# Patient Record
Sex: Female | Born: 1970 | Race: Black or African American | Hispanic: No | Marital: Single | Smoking: Current some day smoker
Health system: Southern US, Community
[De-identification: ages and names within clinical notes are randomized; demographics above are authoritative.]

## PROBLEM LIST (undated history)

## (undated) DIAGNOSIS — I639 Cerebral infarction, unspecified: Secondary | ICD-10-CM

## (undated) DIAGNOSIS — I10 Essential (primary) hypertension: Secondary | ICD-10-CM

## (undated) DIAGNOSIS — B20 Human immunodeficiency virus [HIV] disease: Secondary | ICD-10-CM

## (undated) HISTORY — DX: Cerebral infarction, unspecified: I63.9

---

## 2011-04-29 DIAGNOSIS — Z8719 Personal history of other diseases of the digestive system: Secondary | ICD-10-CM | POA: Insufficient documentation

## 2013-02-28 ENCOUNTER — Emergency Department (HOSPITAL_COMMUNITY)
Admission: EM | Admit: 2013-02-28 | Discharge: 2013-02-28 | Disposition: A | Discharge status: Home / Self Care | Payer: Medicaid Other | Attending: Emergency Medicine | Admitting: Emergency Medicine

## 2013-02-28 ENCOUNTER — Encounter (HOSPITAL_COMMUNITY): Payer: Self-pay | Admitting: Emergency Medicine

## 2013-02-28 DIAGNOSIS — R05 Cough: Secondary | ICD-10-CM | POA: Insufficient documentation

## 2013-02-28 DIAGNOSIS — R059 Cough, unspecified: Secondary | ICD-10-CM | POA: Insufficient documentation

## 2013-02-28 DIAGNOSIS — F17200 Nicotine dependence, unspecified, uncomplicated: Secondary | ICD-10-CM | POA: Insufficient documentation

## 2013-02-28 DIAGNOSIS — Z79899 Other long term (current) drug therapy: Secondary | ICD-10-CM | POA: Insufficient documentation

## 2013-02-28 DIAGNOSIS — H6691 Otitis media, unspecified, right ear: Secondary | ICD-10-CM

## 2013-02-28 DIAGNOSIS — J3489 Other specified disorders of nose and nasal sinuses: Secondary | ICD-10-CM | POA: Insufficient documentation

## 2013-02-28 DIAGNOSIS — J029 Acute pharyngitis, unspecified: Secondary | ICD-10-CM | POA: Insufficient documentation

## 2013-02-28 DIAGNOSIS — H6690 Otitis media, unspecified, unspecified ear: Secondary | ICD-10-CM | POA: Insufficient documentation

## 2013-02-28 MED ORDER — HYDROCODONE-ACETAMINOPHEN 5-325 MG PO TABS: 2.0000 | ORAL_TABLET | Freq: Once | ORAL | Status: DC

## 2013-02-28 MED ORDER — OXYMETAZOLINE HCL 0.05 % NA SOLN
1.0000 | Freq: Once | NASAL | Status: AC
  Administered 2013-02-28: 1 via NASAL
  Filled 2013-02-28: qty 15

## 2013-02-28 MED ORDER — OXYCODONE HCL 5 MG/5ML PO SOLN: 5.0000 mg | ORAL | Status: DC | PRN

## 2013-02-28 MED ORDER — DOCUSATE SODIUM 50 MG/5ML PO LIQD
50.0000 mg | Freq: Once | ORAL | Status: AC
  Administered 2013-02-28: 50 mg via OTIC
  Filled 2013-02-28: qty 10

## 2013-02-28 MED ORDER — FLUTICASONE PROPIONATE 50 MCG/ACT NA SUSP: 2.0000 | Freq: Every day | NASAL | Status: DC

## 2013-02-28 MED ORDER — AMOXICILLIN-POT CLAVULANATE 875-125 MG PO TABS: 1.0000 | ORAL_TABLET | Freq: Two times a day (BID) | ORAL | Status: DC

## 2013-02-28 MED ORDER — OXYCODONE HCL 5 MG/5ML PO SOLN
5.0000 mg | ORAL | Status: AC
  Administered 2013-02-28: 5 mg via ORAL
  Filled 2013-02-28: qty 5

## 2013-02-28 NOTE — ED Provider Notes (Addendum)
CSN: [id number]     Arrival date & time 02/28/13  1622 History  This chart was scribed for non-physician practitioner, Dierdre Forth, PA-C working with Dagmar Hait, MD by Greggory Stallion, ED scribe. This patient was seen in room TR10C/TR10C and the patient's care was started at 4:55 PM.   Chief Complaint  Patient presents with  . Otalgia  . Sore Throat   The history is provided by the patient. No language interpreter was used.    HPI Comments: Melissa Vaughan is a 42 y.o. female who presents to the Emergency Department complaining of gradual onset, constant right otalgia with associated sore throat and body aches that started yesterday. Pt's daughter states that the right side of her face was swollen this morning, but has since resolved. Pt states she has had an intermittent cough and drainage down her throat. She has not taken any medication for the symptoms. Pt denies fever and chills as associated symptoms. She smokes cigarettes sometimes.  She denies sick contacts.  History includes CVA many years ago.  History reviewed. No pertinent past medical history. History reviewed. No pertinent past surgical history. History reviewed. No pertinent family history. History  Substance Use Topics  . Smoking status: Current Every Day Smoker  . Smokeless tobacco: Not on file  . Alcohol Use: No   OB History   Grav Para Term Preterm Abortions TAB SAB Ect Mult Living                 Review of Systems  Constitutional: Negative for fever, diaphoresis, appetite change, fatigue and unexpected weight change.  HENT: Positive for ear pain, congestion, sore throat, rhinorrhea, postnasal drip and sinus pressure. Negative for mouth sores and neck stiffness.   Eyes: Negative for visual disturbance.  Respiratory: Positive for cough. Negative for chest tightness, shortness of breath and wheezing.   Cardiovascular: Negative for chest pain.  Gastrointestinal: Negative for nausea, vomiting,  abdominal pain, diarrhea and constipation.  Endocrine: Negative for polydipsia, polyphagia and polyuria.  Genitourinary: Negative for dysuria, urgency, frequency and hematuria.  Musculoskeletal: Negative for back pain.  Skin: Negative for rash.  Allergic/Immunologic: Negative for immunocompromised state.  Neurological: Negative for syncope, light-headedness and headaches.  Hematological: Does not bruise/bleed easily.  Psychiatric/Behavioral: Negative for sleep disturbance. The patient is not nervous/anxious.   All other systems reviewed and are negative.    Allergies  Review of patient's allergies indicates no known allergies.  Home Medications   Current Outpatient Rx  Name  Route  Sig  Dispense  Refill  . darunavir (PREZISTA) 400 MG tablet   Oral   Take 800 mg by mouth daily with breakfast.         . emtricitabine-tenofovir (TRUVADA) 200-300 MG per tablet   Oral   Take 1 tablet by mouth daily.         Marland Kitchen ibuprofen (ADVIL,MOTRIN) 800 MG tablet   Oral   Take 800 mg by mouth every 8 (eight) hours as needed for pain.         Marland Kitchen lisinopril-hydrochlorothiazide (PRINZIDE,ZESTORETIC) 10-12.5 MG per tablet   Oral   Take 1 tablet by mouth daily.         Marland Kitchen omeprazole (PRILOSEC) 40 MG capsule   Oral   Take 40 mg by mouth daily.         . ritonavir (NORVIR) 100 MG capsule   Oral   Take 100 mg by mouth daily.         . traZODone (DESYREL)  50 MG tablet   Oral   Take 50 mg by mouth at bedtime.         . valACYclovir (VALTREX) 500 MG tablet   Oral   Take 1,000 mg by mouth daily.         Marland Kitchen amoxicillin-clavulanate (AUGMENTIN) 875-125 MG per tablet   Oral   Take 1 tablet by mouth every 12 (twelve) hours.   14 tablet   0   . fluticasone (FLONASE) 50 MCG/ACT nasal spray   Nasal   Place 2 sprays into the nose daily.   16 g   0   . oxyCODONE (ROXICODONE) 5 MG/5ML solution   Oral   Take 5 mLs (5 mg total) by mouth every 4 (four) hours as needed for pain.   35  mL   0     BP 140/124  Pulse 81  Temp(Src) 99.1 F (37.3 C) (Oral)  Resp 16  SpO2 100%  Physical Exam  Nursing note and vitals reviewed. Constitutional: She is oriented to person, place, and time. She appears well-developed and well-nourished. No distress.  HENT:  Head: Normocephalic and atraumatic.  Right Ear: External ear normal.  Left Ear: Hearing, tympanic membrane, external ear and ear canal normal. Tympanic membrane is not erythematous and not bulging.  Nose: Mucosal edema and rhinorrhea present. No epistaxis. Right sinus exhibits no maxillary sinus tenderness and no frontal sinus tenderness. Left sinus exhibits no maxillary sinus tenderness and no frontal sinus tenderness.  Mouth/Throat: Uvula is midline and mucous membranes are normal. Mucous membranes are not pale and not cyanotic. Posterior oropharyngeal erythema present. No oropharyngeal exudate, posterior oropharyngeal edema or tonsillar abscesses.  Right TM blocked by cerumen.  Oropharyngeal erythema without edema or tonsilar enlargement  Eyes: Conjunctivae are normal. Pupils are equal, round, and reactive to light. No scleral icterus.  Neck: Normal range of motion and full passive range of motion without pain. Neck supple.  Pt airway intact Handling secretions without difficulty and tolerating PO without difficulty  Cardiovascular: Normal rate, regular rhythm, normal heart sounds and intact distal pulses.   No murmur heard. Pulmonary/Chest: Effort normal and breath sounds normal. No stridor. No respiratory distress. She has no wheezes.  Clear and equal breath sounds No rales or rhonchi  Abdominal: Soft. Bowel sounds are normal. She exhibits no mass. There is no tenderness. There is no rebound and no guarding.  Musculoskeletal: Normal range of motion. She exhibits no edema and no tenderness.  Lymphadenopathy:    She has no cervical adenopathy.  Neurological: She is alert and oriented to person, place, and time. She  exhibits normal muscle tone. Coordination normal.  Speech is clear and goal oriented Moves extremities without ataxia  Skin: Skin is warm and dry. No rash noted. She is not diaphoretic. No erythema.  Psychiatric: She has a normal mood and affect.    ED Course   EAR CERUMEN REMOVAL Date/Time: 02/28/2013 5:11 PM Performed by: Dierdre Forth Authorized by: Dierdre Forth Consent: Verbal consent obtained. Risks and benefits: risks, benefits and alternatives were discussed Consent given by: patient Patient understanding: patient states understanding of the procedure being performed Patient consent: the patient's understanding of the procedure matches consent given Procedure consent: procedure consent matches procedure scheduled Relevant documents: relevant documents present and verified Site marked: the operative site was marked Required items: required blood products, implants, devices, and special equipment available Patient identity confirmed: verbally with patient and arm band Time out: Immediately prior to procedure a "time out" was  called to verify the correct patient, procedure, equipment, support staff and site/side marked as required. Local anesthetic: none Ceruminolytics applied: Ceruminolytics applied prior to the procedure. Location details: right ear Procedure type: curette and irrigation Patient sedated: no Patient tolerance: Patient tolerated the procedure well with no immediate complications.   (including critical care time)  DIAGNOSTIC STUDIES: Oxygen Saturation is 100% on RA, normal by my interpretation.    COORDINATION OF CARE: 5:43 PM-Discussed treatment plan which includes getting the wax out of her right ear, antibiotic and nasal spray with pt at bedside and pt agreed to plan. Advised pt she has a viral URI.  Labs Reviewed - No data to display No results found. 1. Otitis media, right     MDM  Dairl Ponder presents with right ear pain.  Ear  wax removal from the right ear without complication. On reevaluation after ear wax removal patient with florid otitis media with erythematous and bulging TM with purulent middle ear effusion. Patient presents with otalgia and exam consistent with acute otitis media. No concern for acute mastoiditis, meningitis.  Patient discharged home with Augmentin.  Advised patient to followup with her primary care physician or use the resource guide to find one.  I have also discussed reasons to return immediately to the ER.  Parent expresses understanding and agrees with plan.  I personally performed the services described in this documentation, which was scribed in my presence. The recorded information has been reviewed and is accurate.   Dahlia Client Zakarie Sturdivant, PA-C 02/28/13 2240  Dierdre Forth, PA-C 03/08/13 1511

## 2013-02-28 NOTE — ED Notes (Signed)
Pt c/o right sided earache and sore throat with body aches

## 2013-03-01 NOTE — ED Provider Notes (Signed)
I personally performed the services described in this documentation, which was scribed in my presence. The recorded information has been reviewed and is accurate.   Dagmar Hait, MD 03/01/13 [phone]

## 2013-03-08 NOTE — ED Provider Notes (Signed)
Medical screening examination/treatment/procedure(s) were performed by non-physician practitioner and as supervising physician I was immediately available for consultation/collaboration.   Dagmar Hait, MD 03/08/13 [phone]

## 2013-05-21 ENCOUNTER — Emergency Department (HOSPITAL_COMMUNITY)
Admission: EM | Admit: 2013-05-21 | Discharge: 2013-05-21 | Disposition: A | Discharge status: Home / Self Care | Payer: Medicaid Other | Attending: Emergency Medicine | Admitting: Emergency Medicine

## 2013-05-21 ENCOUNTER — Encounter (HOSPITAL_COMMUNITY): Payer: Self-pay | Admitting: Emergency Medicine

## 2013-05-21 DIAGNOSIS — K08109 Complete loss of teeth, unspecified cause, unspecified class: Secondary | ICD-10-CM | POA: Insufficient documentation

## 2013-05-21 DIAGNOSIS — H9210 Otorrhea, unspecified ear: Secondary | ICD-10-CM | POA: Insufficient documentation

## 2013-05-21 DIAGNOSIS — F17200 Nicotine dependence, unspecified, uncomplicated: Secondary | ICD-10-CM | POA: Insufficient documentation

## 2013-05-21 DIAGNOSIS — Z79899 Other long term (current) drug therapy: Secondary | ICD-10-CM | POA: Insufficient documentation

## 2013-05-21 DIAGNOSIS — K029 Dental caries, unspecified: Secondary | ICD-10-CM | POA: Insufficient documentation

## 2013-05-21 DIAGNOSIS — H9209 Otalgia, unspecified ear: Secondary | ICD-10-CM | POA: Insufficient documentation

## 2013-05-21 MED ORDER — PENICILLIN V POTASSIUM 250 MG PO TABS
500.0000 mg | ORAL_TABLET | Freq: Four times a day (QID) | ORAL | Status: DC
  Administered 2013-05-21: 500 mg via ORAL
  Filled 2013-05-21: qty 2

## 2013-05-21 MED ORDER — HYDROCODONE-ACETAMINOPHEN 5-325 MG PO TABS
1.0000 | ORAL_TABLET | Freq: Once | ORAL | Status: AC
  Administered 2013-05-21: 1 via ORAL
  Filled 2013-05-21: qty 1

## 2013-05-21 MED ORDER — PENICILLIN V POTASSIUM 500 MG PO TABS: 500.0000 mg | ORAL_TABLET | Freq: Three times a day (TID) | ORAL | Status: DC

## 2013-05-21 MED ORDER — HYDROCODONE-ACETAMINOPHEN 5-325 MG PO TABS: 1.0000 | ORAL_TABLET | ORAL | Status: DC | PRN

## 2013-05-21 NOTE — ED Notes (Signed)
Pt c/o R sided tooth and ear ache since last night.

## 2013-05-21 NOTE — ED Provider Notes (Signed)
CSN: [id number]     Arrival date & time 05/21/13  1045 History   This chart was scribed for non-physician practitioner Elpidio Anis, PA-C, working with Enid Skeens, MD, by Yevette Edwards, ED Scribe. This patient was seen in room TR05C/TR05C and the patient's care was started at 11:35 AM.  First MD Initiated Contact with Patient 05/21/13 1100     No chief complaint on file.   The history is provided by the patient. No language interpreter was used.   HPI Comments: Melissa Vaughan is a 42 y.o. female who presents to the Emergency Department complaining of sudden-onset right-sided tooth pain with the associated symptom of right-sided otalgia which began yesterday evening and which she rates as 10/10. She denies any known fractures to the affected  tooth. She reports drainage from her right ear. The pt denies a known fever; her temperature in the ED is 20 F. She denies DM. The pt is a daily smoker.   No past medical history on file. No past surgical history on file. No family history on file. History  Substance Use Topics  . Smoking status: Current Every Day Smoker  . Smokeless tobacco: Not on file  . Alcohol Use: No   No OB history provided.  Review of Systems  Constitutional: Negative for fever.  HENT: Positive for dental problem and ear pain.     Allergies  Review of patient's allergies indicates no known allergies.  Home Medications   Current Outpatient Rx  Name  Route  Sig  Dispense  Refill  . amoxicillin-clavulanate (AUGMENTIN) 875-125 MG per tablet   Oral   Take 1 tablet by mouth every 12 (twelve) hours.   14 tablet   0   . darunavir (PREZISTA) 400 MG tablet   Oral   Take 800 mg by mouth daily with breakfast.         . emtricitabine-tenofovir (TRUVADA) 200-300 MG per tablet   Oral   Take 1 tablet by mouth daily.         . fluticasone (FLONASE) 50 MCG/ACT nasal spray   Nasal   Place 2 sprays into the nose daily.   16 g   0   . ibuprofen  (ADVIL,MOTRIN) 800 MG tablet   Oral   Take 800 mg by mouth every 8 (eight) hours as needed for pain.         Marland Kitchen lisinopril-hydrochlorothiazide (PRINZIDE,ZESTORETIC) 10-12.5 MG per tablet   Oral   Take 1 tablet by mouth daily.         Marland Kitchen omeprazole (PRILOSEC) 40 MG capsule   Oral   Take 40 mg by mouth daily.         Marland Kitchen oxyCODONE (ROXICODONE) 5 MG/5ML solution   Oral   Take 5 mLs (5 mg total) by mouth every 4 (four) hours as needed for pain.   35 mL   0   . ritonavir (NORVIR) 100 MG capsule   Oral   Take 100 mg by mouth daily.         . traZODone (DESYREL) 50 MG tablet   Oral   Take 50 mg by mouth at bedtime.          Triage Vitals: BP 98/66  Pulse 86  Temp(Src) 99 F (37.2 C) (Oral)  Resp 16  Ht 5\' 3"  (1.6 m)  Wt 145 lb (65.772 kg)  BMI 25.69 kg/m2  SpO2 99%  Physical Exam  Nursing note and vitals reviewed. Constitutional: She is oriented to person,  place, and time. She appears well-developed and well-nourished. No distress.  HENT:  Head: Normocephalic and atraumatic.  Right Ear: External ear normal.  Left Ear: External ear normal.  Partial edentureless with marked decay generally. No pointing abscess.  No facial swelling.  No adenopathy.    Eyes: EOM are normal.  Neck: Neck supple. No tracheal deviation present.  Pulmonary/Chest: Effort normal.  Musculoskeletal: Normal range of motion.  Lymphadenopathy:    She has no cervical adenopathy.  Neurological: She is alert and oriented to person, place, and time.  Skin: Skin is warm and dry.  Psychiatric: She has a normal mood and affect. Her behavior is normal.    ED Course  Procedures (including critical care time)  DIAGNOSTIC STUDIES: Oxygen Saturation is 99% on room air, normal by my interpretation.    COORDINATION OF CARE:  11:38 AM- Discussed treatment plan with patient, and the patient agreed to the plan.   Labs Review Labs Reviewed - No data to display Imaging Review No results found.  EKG  Interpretation   None       MDM  No diagnosis found. 1. Dental caries  Uncomplicated dental caries causing jaw and ear pain. No evidence for otitis infection or TMJ.   I personally performed the services described in this documentation, which was scribed in my presence. The recorded information has been reviewed and is accurate.     Arnoldo Hooker, PA-C 05/21/13 1144

## 2013-05-21 NOTE — ED Provider Notes (Signed)
Medical screening examination/treatment/procedure(s) were performed by non-physician practitioner and as supervising physician I was immediately available for consultation/collaboration.  EKG Interpretation   None         Enid Skeens, MD 05/21/13 1720

## 2015-04-12 ENCOUNTER — Emergency Department (HOSPITAL_COMMUNITY)
Admission: EM | Admit: 2015-04-12 | Discharge: 2015-04-12 | Disposition: A | Discharge status: Home / Self Care | Payer: Medicaid Other | Attending: Emergency Medicine | Admitting: Emergency Medicine

## 2015-04-12 ENCOUNTER — Encounter (HOSPITAL_COMMUNITY): Payer: Self-pay | Admitting: Cardiology

## 2015-04-12 DIAGNOSIS — H9201 Otalgia, right ear: Secondary | ICD-10-CM | POA: Diagnosis present

## 2015-04-12 DIAGNOSIS — J029 Acute pharyngitis, unspecified: Secondary | ICD-10-CM | POA: Diagnosis not present

## 2015-04-12 DIAGNOSIS — Z720 Tobacco use: Secondary | ICD-10-CM | POA: Diagnosis not present

## 2015-04-12 DIAGNOSIS — Z79899 Other long term (current) drug therapy: Secondary | ICD-10-CM | POA: Insufficient documentation

## 2015-04-12 MED ORDER — AMOXICILLIN 500 MG PO CAPS: 500.0000 mg | ORAL_CAPSULE | Freq: Three times a day (TID) | ORAL | Status: DC

## 2015-04-12 NOTE — ED Provider Notes (Signed)
CSN: [id number]     Arrival date & time 04/12/15  1010 History  This chart was scribed for non-physician practitioner Roxy Horseman, PA-C, working with Lorre Nick, MD, by Tanda Rockers, ED Scribe. This patient was seen in room TR07C/TR07C and the patient's care was started at 11:24 AM.  Chief Complaint  Patient presents with  . Otalgia  . Sore Throat   The history is provided by the patient. No language interpreter was used.     HPI Comments: Melissa Vaughan is a 44 y.o. female who presents to the Emergency Department complaining of gradual onset, constant, 10/10 right ear pain x 1 day, worsening today. Pt also complains of cough and right sided sore throat when coughing. Denies fever, chills, or any other associated symptoms. No recent sick contact with similar symptoms. Pt has not been swimming recently.   History reviewed. No pertinent past medical history. History reviewed. No pertinent past surgical history. History reviewed. No pertinent family history. Social History  Substance Use Topics  . Smoking status: Current Every Day Smoker    Types: Cigarettes  . Smokeless tobacco: None  . Alcohol Use: No   OB History    No data available     Review of Systems  Constitutional: Negative for fever and chills.  HENT: Positive for ear pain (Right) and sore throat.   Respiratory: Positive for cough.   Musculoskeletal: Negative for neck pain.  Neurological: Negative for headaches.    Allergies  Review of patient's allergies indicates no known allergies.  Home Medications   Prior to Admission medications   Medication Sig Start Date End Date Taking? Authorizing Provider  darunavir (PREZISTA) 400 MG tablet Take 400 mg by mouth daily.     Historical Provider, MD  emtricitabine-tenofovir (TRUVADA) 200-300 MG per tablet Take 1 tablet by mouth daily.    Historical Provider, MD  HYDROcodone-acetaminophen (NORCO/VICODIN) 5-325 MG per tablet Take 1-2 tablets by mouth every 4 (four)  hours as needed for pain. 05/21/13   Elpidio Anis, PA-C  lisinopril-hydrochlorothiazide (PRINZIDE,ZESTORETIC) 10-12.5 MG per tablet Take 1 tablet by mouth daily.    Historical Provider, MD  penicillin v potassium (VEETID) 500 MG tablet Take 1 tablet (500 mg total) by mouth 3 (three) times daily. 05/21/13   Elpidio Anis, PA-C  ritonavir (NORVIR) 100 MG capsule Take 100 mg by mouth daily.    Historical Provider, MD   Triage Vitals: BP 113/77 mmHg  Pulse 97  Temp(Src) 98.5 F (36.9 C) (Oral)  Resp 16  SpO2 100%  LMP 03/12/2015   Physical Exam  Constitutional: She is oriented to person, place, and time. She appears well-developed and well-nourished. No distress.  HENT:  Head: Normocephalic and atraumatic.  Right Ear: External ear normal.  Left Ear: External ear normal.  Mildly erythematous, no tonsillar exudate, no abscess, no stridor, uvula is midline  Right TM moderately erythematous with effusion  Eyes: Conjunctivae and EOM are normal. Pupils are equal, round, and reactive to light.  Neck: Normal range of motion. Neck supple. No tracheal deviation present.  Cardiovascular: Normal rate, regular rhythm and normal heart sounds.  Exam reveals no gallop and no friction rub.   No murmur heard. Pulmonary/Chest: Effort normal and breath sounds normal. No stridor. No respiratory distress. She has no wheezes. She has no rales. She exhibits no tenderness.  CTAB  Abdominal: Soft. Bowel sounds are normal. She exhibits no distension. There is no tenderness.  Musculoskeletal: Normal range of motion. She exhibits no tenderness.  Neurological: She  is alert and oriented to person, place, and time.  Skin: Skin is warm and dry. No rash noted. She is not diaphoretic.  Psychiatric: She has a normal mood and affect. Her behavior is normal. Judgment and thought content normal.  Nursing note and vitals reviewed.   ED Course  Procedures (including critical care time)  DIAGNOSTIC STUDIES: Oxygen  Saturation is 100% on RA, normal by my interpretation.    COORDINATION OF CARE: 11:26 AM-Discussed treatment plan which includes Rx antibiotics with pt at bedside and pt agreed to plan.    MDM   Final diagnoses:  Otalgia, right   Symptoms and physical exam consistent with right-sided otitis media, will treat with amoxicillin. Discharged home.  I personally performed the services described in this documentation, which was scribed in my presence. The recorded information has been reviewed and is accurate.       Roxy Horseman, PA-C 04/13/15 1612  Lorre Nick, MD 04/16/15 [phone]

## 2015-04-12 NOTE — ED Notes (Signed)
Pt reports right ear pain and sore throat since last night.

## 2015-04-12 NOTE — Discharge Instructions (Signed)
Otalgia  The most common reason for this in children is an infection of the middle ear. Pain from the middle ear is usually caused by a build-up of fluid and pressure behind the eardrum. Pain from an earache can be sharp, dull, or burning. The pain may be temporary or constant. The middle ear is connected to the nasal passages by a short narrow tube called the Eustachian tube. The Eustachian tube allows fluid to drain out of the middle ear, and helps keep the pressure in your ear equalized.  CAUSES   A cold or allergy can block the Eustachian tube with inflammation and the build-up of secretions. This is especially likely in small children, because their Eustachian tube is shorter and more horizontal. When the Eustachian tube closes, the normal flow of fluid from the middle ear is stopped. Fluid can accumulate and cause stuffiness, pain, hearing loss, and an ear infection if germs start growing in this area.  SYMPTOMS   The symptoms of an ear infection may include fever, ear pain, fussiness, increased crying, and irritability. Many children will have temporary and minor hearing loss during and right after an ear infection. Permanent hearing loss is rare, but the risk increases the more infections a child has. Other causes of ear pain include retained water in the outer ear canal from swimming and bathing.  Ear pain in adults is less likely to be from an ear infection. Ear pain may be referred from other locations. Referred pain may be from the joint between your jaw and the skull. It may also come from a tooth problem or problems in the neck. Other causes of ear pain include:   A foreign body in the ear.   Outer ear infection.   Sinus infections.   Impacted ear wax.   Ear injury.   Arthritis of the jaw or TMJ problems.   Middle ear infection.   Tooth infections.   Sore throat with pain to the ears.  DIAGNOSIS   Your caregiver can usually make the diagnosis by examining you. Sometimes other special studies,  including x-rays and lab work may be necessary.  TREATMENT    If antibiotics were prescribed, use them as directed and finish them even if you or your child's symptoms seem to be improved.   Sometimes PE tubes are needed in children. These are little plastic tubes which are put into the eardrum during a simple surgical procedure. They allow fluid to drain easier and allow the pressure in the middle ear to equalize. This helps relieve the ear pain caused by pressure changes.  HOME CARE INSTRUCTIONS    Only take over-the-counter or prescription medicines for pain, discomfort, or fever as directed by your caregiver. DO NOT GIVE CHILDREN ASPIRIN because of the association of Reye's Syndrome in children taking aspirin.   Use a cold pack applied to the outer ear for 15-20 minutes, 03-04 times per day or as needed may reduce pain. Do not apply ice directly to the skin. You may cause frost bite.   Over-the-counter ear drops used as directed may be effective. Your caregiver may sometimes prescribe ear drops.   Resting in an upright position may help reduce pressure in the middle ear and relieve pain.   Ear pain caused by rapidly descending from high altitudes can be relieved by swallowing or chewing gum. Allowing infants to suck on a bottle during airplane travel can help.   Do not smoke in the house or near children. If you are   unable to quit smoking, smoke outside.   Control allergies.  SEEK IMMEDIATE MEDICAL CARE IF:    You or your child are becoming sicker.   Pain or fever relief is not obtained with medicine.   You or your child's symptoms (pain, fever, or irritability) do not improve within 24 to 48 hours or as instructed.   Severe pain suddenly stops hurting. This may indicate a ruptured eardrum.   You or your children develop new problems such as severe headaches, stiff neck, difficulty swallowing, or swelling of the face or around the ear.  Document Released: 02/21/2004 Document Revised: 09/28/2011  Document Reviewed: 06/27/2008  [employer] Patient Information 2015 [employer]. This information is not intended to replace advice given to you by your health care provider. Make sure you discuss any questions you have with your health care provider.

## 2015-11-09 ENCOUNTER — Encounter (HOSPITAL_COMMUNITY): Payer: Self-pay

## 2015-11-09 ENCOUNTER — Emergency Department (HOSPITAL_COMMUNITY): Payer: Medicaid Other

## 2015-11-09 ENCOUNTER — Emergency Department (HOSPITAL_COMMUNITY)
Admission: EM | Admit: 2015-11-09 | Discharge: 2015-11-09 | Disposition: A | Discharge status: Home / Self Care | Payer: Medicaid Other | Attending: Emergency Medicine | Admitting: Emergency Medicine

## 2015-11-09 DIAGNOSIS — N83202 Unspecified ovarian cyst, left side: Secondary | ICD-10-CM | POA: Insufficient documentation

## 2015-11-09 DIAGNOSIS — R109 Unspecified abdominal pain: Secondary | ICD-10-CM | POA: Diagnosis present

## 2015-11-09 DIAGNOSIS — I10 Essential (primary) hypertension: Secondary | ICD-10-CM | POA: Insufficient documentation

## 2015-11-09 DIAGNOSIS — R51 Headache: Secondary | ICD-10-CM | POA: Insufficient documentation

## 2015-11-09 DIAGNOSIS — F17210 Nicotine dependence, cigarettes, uncomplicated: Secondary | ICD-10-CM | POA: Insufficient documentation

## 2015-11-09 DIAGNOSIS — Z79899 Other long term (current) drug therapy: Secondary | ICD-10-CM | POA: Insufficient documentation

## 2015-11-09 DIAGNOSIS — N3000 Acute cystitis without hematuria: Secondary | ICD-10-CM | POA: Diagnosis not present

## 2015-11-09 DIAGNOSIS — Z3202 Encounter for pregnancy test, result negative: Secondary | ICD-10-CM | POA: Insufficient documentation

## 2015-11-09 DIAGNOSIS — R519 Headache, unspecified: Secondary | ICD-10-CM

## 2015-11-09 HISTORY — DX: Essential (primary) hypertension: I10

## 2015-11-09 LAB — COMPREHENSIVE METABOLIC PANEL
ALBUMIN: 3.8 g/dL (ref 3.5–5.0)
ALT: 10 U/L — ABNORMAL LOW (ref 14–54)
ANION GAP: 13 (ref 5–15)
AST: 17 U/L (ref 15–41)
Alkaline Phosphatase: 77 U/L (ref 38–126)
BUN: 12 mg/dL (ref 6–20)
CO2: 18 mmol/L — AB (ref 22–32)
Calcium: 8.9 mg/dL (ref 8.9–10.3)
Chloride: 109 mmol/L (ref 101–111)
Creatinine, Ser: 1.1 mg/dL — ABNORMAL HIGH (ref 0.44–1.00)
GFR calc Af Amer: >60 mL/min (ref >60)
GFR calc non Af Amer: >60 mL/min (ref >60)
GLUCOSE: 138 mg/dL — AB (ref 65–99)
POTASSIUM: 3.3 mmol/L — AB (ref 3.5–5.1)
SODIUM: 140 mmol/L (ref 135–145)
TOTAL PROTEIN: 7.4 g/dL (ref 6.5–8.1)
Total Bilirubin: 0.5 mg/dL (ref 0.3–1.2)

## 2015-11-09 LAB — URINALYSIS, ROUTINE W REFLEX MICROSCOPIC
Bilirubin Urine: NEGATIVE
Glucose, UA: NEGATIVE mg/dL
KETONES UR: NEGATIVE mg/dL
NITRITE: POSITIVE — AB
PH: 6 (ref 5.0–8.0)
Protein, ur: NEGATIVE mg/dL
SPECIFIC GRAVITY, URINE: 1.022 (ref 1.005–1.030)

## 2015-11-09 LAB — CBC WITH DIFFERENTIAL/PLATELET
BASOS ABS: 0 10*3/uL (ref 0.0–0.1)
BASOS PCT: 0 %
Eosinophils Absolute: 0.4 10*3/uL (ref 0.0–0.7)
Eosinophils Relative: 8 %
HCT: 35.5 % — ABNORMAL LOW (ref 36.0–46.0)
Hemoglobin: 11.5 g/dL — ABNORMAL LOW (ref 12.0–15.0)
LYMPHS PCT: 33 %
Lymphs Abs: 1.7 10*3/uL (ref 0.7–4.0)
MCH: 29.5 pg (ref 26.0–34.0)
MCHC: 32.4 g/dL (ref 30.0–36.0)
MCV: 91 fL (ref 78.0–100.0)
MONOS PCT: 8 %
Monocytes Absolute: 0.4 10*3/uL (ref 0.1–1.0)
NEUTROS ABS: 2.6 10*3/uL (ref 1.7–7.7)
NEUTROS PCT: 51 %
Platelets: 225 10*3/uL (ref 150–400)
RBC: 3.9 MIL/uL (ref 3.87–5.11)
RDW: 13.6 % (ref 11.5–15.5)
WBC: 5.1 10*3/uL (ref 4.0–10.5)

## 2015-11-09 LAB — URINE MICROSCOPIC-ADD ON: RBC / HPF: NONE SEEN RBC/hpf (ref 0–5)

## 2015-11-09 LAB — PREGNANCY, URINE: PREG TEST UR: NEGATIVE

## 2015-11-09 LAB — LIPASE, BLOOD: Lipase: 26 U/L (ref 11–51)

## 2015-11-09 MED ORDER — IBUPROFEN 400 MG PO TABS
600.0000 mg | ORAL_TABLET | Freq: Once | ORAL | Status: AC
  Administered 2015-11-09: 600 mg via ORAL
  Filled 2015-11-09: qty 1

## 2015-11-09 MED ORDER — IOPAMIDOL (ISOVUE-300) INJECTION 61%
INTRAVENOUS | Status: AC
Start: 2015-11-09 — End: 2015-11-09
  Administered 2015-11-09: 100 mL
  Filled 2015-11-09: qty 100

## 2015-11-09 MED ORDER — CEPHALEXIN 500 MG PO CAPS: 500.0000 mg | ORAL_CAPSULE | Freq: Three times a day (TID) | ORAL | Status: DC

## 2015-11-09 MED ORDER — BUTALBITAL-APAP-CAFFEINE 50-325-40 MG PO TABS
1.0000 | ORAL_TABLET | Freq: Once | ORAL | Status: AC
  Administered 2015-11-09: 1 via ORAL
  Filled 2015-11-09: qty 1

## 2015-11-09 NOTE — ED Notes (Signed)
MD at bedside. 

## 2015-11-09 NOTE — ED Notes (Addendum)
Pt. Coming from home c/o abd. Pain and headache starting last night. Pt. sts her stomach is bloated and it hurts when she urinates. Pt. Denies N/V/D. Pt. sts headache 9/10. Pt. On cell phone at this time.

## 2015-11-09 NOTE — Discharge Instructions (Signed)
Continue to take Tylenol and Motrin as needed for your headache and left-sided abdominal pain which is due to her ovarian cyst. Return for worsening symptoms including fever, worsening pain, vomiting unable to keep down food or fluids, or any other symptoms concerning to you.  General Headache Without Cause A headache is pain or discomfort felt around the head or neck area. The specific cause of a headache may not be found. There are many causes and types of headaches. A few common ones are:  Tension headaches.  Migraine headaches.  Cluster headaches.  Chronic daily headaches. HOME CARE INSTRUCTIONS  Watch your condition for any changes. Take these steps to help with your condition: Managing Pain  Take over-the-counter and prescription medicines only as told by your health care provider.  Lie down in a dark, quiet room when you have a headache.  If directed, apply ice to the head and neck area:  Put ice in a plastic bag.  Place a towel between your skin and the bag.  Leave the ice on for 20 minutes, 2-3 times per day.  Use a heating pad or hot shower to apply heat to the head and neck area as told by your health care provider.  Keep lights dim if bright lights bother you or make your headaches worse. Eating and Drinking  Eat meals on a regular schedule.  Limit alcohol use.  Decrease the amount of caffeine you drink, or stop drinking caffeine. General Instructions  Keep all follow-up visits as told by your health care provider. This is important.  Keep a headache journal to help find out what may trigger your headaches. For example, write down:  What you eat and drink.  How much sleep you get.  Any change to your diet or medicines.  Try massage or other relaxation techniques.  Limit stress.  Sit up straight, and do not tense your muscles.  Do not use tobacco products, including cigarettes, chewing tobacco, or e-cigarettes. If you need help quitting, ask your  health care provider.  Exercise regularly as told by your health care provider.  Sleep on a regular schedule. Get 7-9 hours of sleep, or the amount recommended by your health care provider. SEEK MEDICAL CARE IF:   Your symptoms are not helped by medicine.  You have a headache that is different from the usual headache.  You have nausea or you vomit.  You have a fever. SEEK IMMEDIATE MEDICAL CARE IF:   Your headache becomes severe.  You have repeated vomiting.  You have a stiff neck.  You have a loss of vision.  You have problems with speech.  You have pain in the eye or ear.  You have muscular weakness or loss of muscle control.  You lose your balance or have trouble walking.  You feel faint or pass out.  You have confusion.   This information is not intended to replace advice given to you by your health care provider. Make sure you discuss any questions you have with your health care provider.   Document Released: 07/06/2005 Document Revised: 03/27/2015 Document Reviewed: 10/29/2014 Elsevier Interactive Patient Education [employer]2016 [employer] Inc[employer]

## 2015-11-09 NOTE — ED Provider Notes (Signed)
CSN: [id number]     Arrival date & time 11/09/15  0845 History   First MD Initiated Contact with Patient 11/09/15 [id number]     Chief Complaint  Patient presents with  . Abdominal Pain  . Headache     (Consider location/radiation/quality/duration/timing/severity/associated sxs/prior Treatment) HPI 45 year old female who presents with abdominal pain and headache. She has a history of hypertension. No prior abdominal surgeries. States that she has been dealing with on and off abdominal pain since 2007. States that she has been seen by multiple providers from different health care systems in the past and was never told why she may have this abdominal pain. States that she has not had this worked up in the past. States that she had abdominal pain starting yesterday evening generalized, but worse over the left side of her abdomen. Crampy in nature. States that often her abdomen becomes very bloated and hard and then subsequently become soft. States that she had normal bowel movement yesterday and this morning. No nausea, vomiting, diarrhea, melena, hematochezia. Has noted dysuria but no urinary frequency or flank pain. No abnormal vaginal bleeding or discharge.  In regards to her headache, states that she normally has headache related to her elevated blood pressure improved after taking her blood pressure medications. It is sharp and intermittent in nature. Current headache has been on and off since yesterday evening. Has not had any vision or speech changes, focal numbness or weakness, ataxia.  Past Medical History  Diagnosis Date  . Hypertension    History reviewed. No pertinent past surgical history. History reviewed. No pertinent family history. Social History  Substance Use Topics  . Smoking status: Current Some Day Smoker    Types: Cigarettes  . Smokeless tobacco: None  . Alcohol Use: No   OB History    No data available     Review of Systems 10/14 systems reviewed and are negative  other than those stated in the HPI    Allergies  Review of patient's allergies indicates no known allergies.  Home Medications   Prior to Admission medications   Medication Sig Start Date End Date Taking? Authorizing Provider  lisinopril-hydrochlorothiazide (PRINZIDE,ZESTORETIC) 20-12.5 MG tablet Take 1 tablet by mouth daily.   Yes Historical Provider, MD  ritonavir (NORVIR) 100 MG capsule Take 100 mg by mouth daily.   Yes Historical Provider, MD  cephALEXin (KEFLEX) 500 MG capsule Take 1 capsule (500 mg total) by mouth 3 (three) times daily. 11/09/15   Lavera Guiseana Duo Evander Macaraeg, MD   BP 102/61 mmHg  Pulse 72  Temp(Src) 98.8 F (37.1 C) (Oral)  Resp 15  SpO2 100%  LMP 10/26/2015 Physical Exam Physical Exam  Nursing note and vitals reviewed. Constitutional: Well developed, well nourished, non-toxic, and in no acute distress Head: Normocephalic and atraumatic.  Mouth/Throat: Oropharynx is clear and moist.  Neck: Normal range of motion. Neck supple.  Cardiovascular: Normal rate and regular rhythm.   Pulmonary/Chest: Effort normal and breath sounds normal.  Abdominal: Soft. Nondistended. Tenderness over the left hemiabdomen. No CVA tenderness.There is no rebound and no guarding.  Musculoskeletal: Normal range of motion.  Neurological: Alert, no facial droop, fluent speech, moves all extremities symmetrically, PERRL, EOMI, sensation to light touch in tact throughout Skin: Skin is warm and dry.  Psychiatric: Cooperative  ED Course  Procedures (including critical care time) Labs Review Labs Reviewed  CBC WITH DIFFERENTIAL/PLATELET - Abnormal; Notable for the following:    Hemoglobin 11.5 (*)    HCT 35.5 (*)  All other components within normal limits  COMPREHENSIVE METABOLIC PANEL - Abnormal; Notable for the following:    Potassium 3.3 (*)    CO2 18 (*)    Glucose, Bld 138 (*)    Creatinine, Ser 1.10 (*)    ALT 10 (*)    All other components within normal limits  URINALYSIS, ROUTINE W  REFLEX MICROSCOPIC (NOT AT [facility]) - Abnormal; Notable for the following:    APPearance TURBID (*)    Hgb urine dipstick SMALL (*)    Nitrite POSITIVE (*)    Leukocytes, UA SMALL (*)    All other components within normal limits  URINE MICROSCOPIC-ADD ON - Abnormal; Notable for the following:    Squamous Epithelial / LPF 6-30 (*)    Bacteria, UA MANY (*)    All other components within normal limits  URINE CULTURE  LIPASE, BLOOD  PREGNANCY, URINE    Imaging Review Ct Abdomen Pelvis W Contrast  11/09/2015  CLINICAL DATA:  Left lower quadrant pain for 1 day. EXAM: CT ABDOMEN AND PELVIS WITH CONTRAST TECHNIQUE: Multidetector CT imaging of the abdomen and pelvis was performed using the standard protocol following bolus administration of intravenous contrast. CONTRAST:  100 mL Isovue 300 COMPARISON:  None. FINDINGS: Lower chest: 3 mm pulmonary nodule seen in the lateral right lung base on image 7/series 205. Hepatobiliary: No masses or other significant abnormality. Gallbladder is unremarkable. Pancreas: No mass, inflammatory changes, or other significant abnormality. Spleen: Within normal limits in size and appearance. Adrenals/Urinary Tract: No masses identified. No evidence of hydronephrosis. Stomach/Bowel: No evidence of obstruction, inflammatory process, or abnormal fluid collections. Vascular/Lymphatic: No pathologically enlarged lymph nodes. No evidence of abdominal aortic aneurysm. Reproductive: No mass or other significant abnormality. 2.1 cm left ovarian corpus luteum cyst incidentally noted. Other: None. Musculoskeletal:  No suspicious bone lesions identified. IMPRESSION: 2.1 cm left ovarian corpus luteum cyst, which is most likely physiologic in a reproductive age female. 3 mm indeterminate pulmonary nodule in the lateral right lung base. No follow-up needed if patient is low-risk. Non-contrast chest CT can be considered in 12 months if patient is high-risk. This recommendation follows the  consensus statement: Guidelines for Management of Incidental Pulmonary Nodules Detected on CT Images:From the Fleischner Society 2017; published online before print (10.1148/radiol.1610960454). Electronically Signed   By: Myles Rosenthal M.D.   On: 11/09/2015 12:53   I have personally reviewed and evaluated these images and lab results as part of my medical decision-making.   EKG Interpretation None      MDM   Final diagnoses:  Nonintractable headache, unspecified chronicity pattern, unspecified headache type  Left ovarian cyst  Acute cystitis without hematuria    45 year old female who presents with left lower abdominal pain and headache. She is well-appearing in no acute distress with stable vital signs. She is neurologically intact, and headache seems consistent with that of likely tension headache. She has no concerning features that would suggest presence of intracranial infection, hemorrhage, mass. Abdomen is overall benign with some left lower quadrant tenderness to palpation. Basic blood work including CBC, complete metabolic profile, lipase is unremarkable. She is not pregnant. Her UA does suggest some cystitis and she'll be given a course of Keflex. Urinalysis of for culture. A CT abdomen pelvis is performed to look for potential diverticulitis versus other acute infectious or surgical process. She does not have any acute intra-abdominal process but notable left ovarian corpus luteum cyst which is the likely etiology of her symptoms. Pain well controlled and  no concern for torsion at this time. Felt that she would be appropriate for discharge and outpatient management. Discussed continued supportive management for home. Strict return follow-up instructions are reviewed. She expressed understanding of all discharge instructions, and felt comfortable with the plan of care   Lavera Guise, MD 11/09/15 1746

## 2015-11-11 LAB — URINE CULTURE

## 2016-04-09 DIAGNOSIS — F39 Unspecified mood [affective] disorder: Secondary | ICD-10-CM | POA: Insufficient documentation

## 2016-07-23 DIAGNOSIS — G8929 Other chronic pain: Secondary | ICD-10-CM | POA: Insufficient documentation

## 2016-09-29 ENCOUNTER — Emergency Department (HOSPITAL_COMMUNITY): Payer: Medicaid Other

## 2016-09-29 ENCOUNTER — Emergency Department (HOSPITAL_COMMUNITY)
Admission: EM | Admit: 2016-09-29 | Discharge: 2016-09-29 | Disposition: A | Discharge status: Home / Self Care | Payer: Medicaid Other | Attending: Emergency Medicine | Admitting: Emergency Medicine

## 2016-09-29 ENCOUNTER — Encounter (HOSPITAL_COMMUNITY): Payer: Self-pay | Admitting: Emergency Medicine

## 2016-09-29 DIAGNOSIS — I10 Essential (primary) hypertension: Secondary | ICD-10-CM | POA: Insufficient documentation

## 2016-09-29 DIAGNOSIS — R112 Nausea with vomiting, unspecified: Secondary | ICD-10-CM | POA: Insufficient documentation

## 2016-09-29 DIAGNOSIS — R05 Cough: Secondary | ICD-10-CM | POA: Diagnosis not present

## 2016-09-29 DIAGNOSIS — R1031 Right lower quadrant pain: Secondary | ICD-10-CM | POA: Diagnosis not present

## 2016-09-29 DIAGNOSIS — Z79899 Other long term (current) drug therapy: Secondary | ICD-10-CM | POA: Diagnosis not present

## 2016-09-29 DIAGNOSIS — F17210 Nicotine dependence, cigarettes, uncomplicated: Secondary | ICD-10-CM | POA: Insufficient documentation

## 2016-09-29 DIAGNOSIS — R059 Cough, unspecified: Secondary | ICD-10-CM

## 2016-09-29 LAB — URINALYSIS, ROUTINE W REFLEX MICROSCOPIC
BILIRUBIN URINE: NEGATIVE
GLUCOSE, UA: NEGATIVE mg/dL
KETONES UR: NEGATIVE mg/dL
Nitrite: POSITIVE — AB
PH: 6 (ref 5.0–8.0)
Protein, ur: 30 mg/dL — AB
Specific Gravity, Urine: 1.025 (ref 1.005–1.030)

## 2016-09-29 LAB — CBC
HEMATOCRIT: 38.2 % (ref 36.0–46.0)
Hemoglobin: 12.7 g/dL (ref 12.0–15.0)
MCH: 29.6 pg (ref 26.0–34.0)
MCHC: 33.2 g/dL (ref 30.0–36.0)
MCV: 89 fL (ref 78.0–100.0)
Platelets: 210 10*3/uL (ref 150–400)
RBC: 4.29 MIL/uL (ref 3.87–5.11)
RDW: 13.7 % (ref 11.5–15.5)
WBC: 4.5 10*3/uL (ref 4.0–10.5)

## 2016-09-29 LAB — COMPREHENSIVE METABOLIC PANEL
ALBUMIN: 3.9 g/dL (ref 3.5–5.0)
ALT: 14 U/L (ref 14–54)
AST: 19 U/L (ref 15–41)
Alkaline Phosphatase: 75 U/L (ref 38–126)
Anion gap: 11 (ref 5–15)
BILIRUBIN TOTAL: 0.5 mg/dL (ref 0.3–1.2)
BUN: 12 mg/dL (ref 6–20)
CHLORIDE: 108 mmol/L (ref 101–111)
CO2: 19 mmol/L — ABNORMAL LOW (ref 22–32)
CREATININE: 0.92 mg/dL (ref 0.44–1.00)
Calcium: 8.7 mg/dL — ABNORMAL LOW (ref 8.9–10.3)
GFR calc Af Amer: >60 mL/min (ref >60)
GFR calc non Af Amer: >60 mL/min (ref >60)
GLUCOSE: 128 mg/dL — AB (ref 65–99)
POTASSIUM: 3.6 mmol/L (ref 3.5–5.1)
Sodium: 138 mmol/L (ref 135–145)
Total Protein: 7.6 g/dL (ref 6.5–8.1)

## 2016-09-29 LAB — LIPASE, BLOOD: LIPASE: 50 U/L (ref 11–51)

## 2016-09-29 LAB — URINALYSIS, MICROSCOPIC (REFLEX)

## 2016-09-29 MED ORDER — BENZONATATE 100 MG PO CAPS: 100.0000 mg | ORAL_CAPSULE | Freq: Three times a day (TID) | ORAL | 0 refills | Status: DC

## 2016-09-29 MED ORDER — ONDANSETRON 4 MG PO TBDP: 4.0000 mg | ORAL_TABLET | Freq: Three times a day (TID) | ORAL | 0 refills | Status: DC | PRN

## 2016-09-29 MED ORDER — ONDANSETRON 4 MG PO TBDP
4.0000 mg | ORAL_TABLET | Freq: Once | ORAL | Status: AC
  Administered 2016-09-29: 4 mg via ORAL
  Filled 2016-09-29: qty 1

## 2016-09-29 MED ORDER — NITROFURANTOIN MONOHYD MACRO 100 MG PO CAPS: 100.0000 mg | ORAL_CAPSULE | Freq: Two times a day (BID) | ORAL | 0 refills | Status: AC

## 2016-09-29 MED ORDER — DOXYCYCLINE HYCLATE 100 MG PO CAPS: 100.0000 mg | ORAL_CAPSULE | Freq: Two times a day (BID) | ORAL | 0 refills | Status: AC

## 2016-09-29 MED ORDER — BENZONATATE 100 MG PO CAPS
100.0000 mg | ORAL_CAPSULE | Freq: Once | ORAL | Status: AC
  Administered 2016-09-29: 100 mg via ORAL
  Filled 2016-09-29: qty 1

## 2016-09-29 NOTE — Discharge Instructions (Signed)

## 2016-09-29 NOTE — ED Provider Notes (Signed)
Emergency Department Provider Note   I have reviewed the triage vital signs and the nursing notes.   HISTORY  Chief Complaint Cough and Abdominal Pain   HPI Melissa Vaughan is a 46 y.o. female with PMH of HTN presents to the emergency department for evaluation of cough, abdominal pain, vomiting, nausea that started yesterday. The patient is an active smoker. She notes other people she is close with have similar respiratory symptoms. She denies any fever or shaking chills. Cough is nonproductive. Her abdominal pain is mostly on the right side she states it's worse when she coughs or eats. She notes vomiting episodes after severe bouts of coughing. No associated chest pain. She denies any vaginal bleeding or discharge. No back pain. No blood in her emesis or stool. Symptoms are non-radiating.    Past Medical History:  Diagnosis Date  . Hypertension     There are no active problems to display for this patient.   History reviewed. No pertinent surgical history.  Current Outpatient Rx  . Order #: [id number] Class: Historical Med  . Order #: [id number] Class: Historical Med  . Order #: [id number] Class: Historical Med  . Order #: [id number] Class: Print  . Order #: [id number] Class: Print  . Order #: [id number] Class: Print  . Order #: [id number] Class: Print  . Order #: [id number] Class: Print    Allergies Patient has no known allergies.  History reviewed. No pertinent family history.  Social History Social History  Substance Use Topics  . Smoking status: Current Some Day Smoker    Types: Cigarettes  . Smokeless tobacco: Never Used  . Alcohol use No    Review of Systems  Constitutional: No fever/chills Eyes: No visual changes. ENT: No sore throat. Cardiovascular: Denies chest pain. Respiratory: Denies shortness of breath. Positive cough.  Gastrointestinal: Positive abdominal pain. Positive nausea, vomiting.  No diarrhea.  No constipation. Genitourinary: Negative for  dysuria. Musculoskeletal: Negative for back pain. Skin: Negative for rash. Neurological: Negative for headaches, focal weakness or numbness.  10-point ROS otherwise negative.  ____________________________________________   PHYSICAL EXAM:  VITAL SIGNS: ED Triage Vitals  Enc Vitals Group     BP 09/29/16 0857 105/82     Pulse Rate 09/29/16 0857 92     Resp 09/29/16 0857 22     Temp 09/29/16 0857 97.7 F (36.5 C)     Temp Source 09/29/16 0857 Oral     SpO2 09/29/16 0857 100 %     Weight 09/29/16 0858 145 lb (65.8 kg)     Height 09/29/16 0858 5\' 4"  (1.626 m)     Pain Score 09/29/16 0859 9   Constitutional: Alert and oriented. Well appearing and in no acute distress. Eyes: Conjunctivae are normal.  Head: Atraumatic. Nose: No congestion/rhinnorhea. Mouth/Throat: Mucous membranes are moist.   Neck: No stridor.   Cardiovascular: Normal rate, regular rhythm. Good peripheral circulation. Grossly normal heart sounds.   Respiratory: Normal respiratory effort.  No retractions. Lungs CTAB. Gastrointestinal: Soft with mild RLQ tenderness to palpation. No rebound or guarding. No distention.  Musculoskeletal: No lower extremity tenderness nor edema. No gross deformities of extremities. Neurologic:  Normal speech and language. No gross focal neurologic deficits are appreciated.  Skin:  Skin is warm, dry and intact. No rash noted. Psychiatric: Mood and affect are normal. Speech and behavior are normal.  ____________________________________________   LABS (all labs ordered are listed, but only abnormal results are displayed)  Labs Reviewed  COMPREHENSIVE METABOLIC PANEL - Abnormal; Notable for the following:  Result Value   CO2 19 (*)    Glucose, Bld 128 (*)    Calcium 8.7 (*)    All other components within normal limits  URINALYSIS, ROUTINE W REFLEX MICROSCOPIC - Abnormal; Notable for the following:    APPearance TURBID (*)    Hgb urine dipstick TRACE (*)    Protein, ur 30 (*)     Nitrite POSITIVE (*)    Leukocytes, UA SMALL (*)    All other components within normal limits  URINALYSIS, MICROSCOPIC (REFLEX) - Abnormal; Notable for the following:    Bacteria, UA MANY (*)    Squamous Epithelial / LPF 6-30 (*)    All other components within normal limits  LIPASE, BLOOD  CBC   ____________________________________________  RADIOLOGY  Dg Chest 2 View  Result Date: 09/29/2016 CLINICAL DATA:  Cough for 2 days, history hypertension and smoking EXAM: CHEST  2 VIEW COMPARISON:  None FINDINGS: Normal heart size, mediastinal contours, and pulmonary vascularity. Question minimal patchy infiltrate in lower RIGHT lung. Remaining lungs clear. No pleural effusion or pneumothorax. Bones unremarkable. IMPRESSION: Question subtle patchy infiltrate in the lower RIGHT lung. Electronically Signed   By: Ulyses Southward M.D.   On: 09/29/2016 09:27    ____________________________________________   PROCEDURES  Procedure(s) performed:   Procedures  None ____________________________________________   INITIAL IMPRESSION / ASSESSMENT AND PLAN / ED COURSE  Pertinent labs & imaging results that were available during my care of the patient were reviewed by me and considered in my medical decision making (see chart for details).  Patient resents to the emergency department for evaluation of persistent cough with associated nausea, vomiting, abdominal pain. Her cough seems to be driving many of her gastrointestinal symptoms. She describes vomiting after forceful coughing and worsening abdominal pain with coughing. She has mild tenderness in her right lower quadrant.   12:05 PM Patient is feeling much better. Coughing is improved. She is tolerating PO. She has some evidence of bacteria in the urine so will treat with Macrobid for the next 5 days. Also cover him with doxycycline for questionable infiltrate and significant coughing symptoms. Will discharge home with Tessalon Perles and Zofran  as well. Discussed return precautions. Provided number for primary care physician follow-up. Re-examination of the abdomen is benign.   At this time, I do not feel there is any life-threatening condition present. I have reviewed and discussed all results (EKG, imaging, lab, urine as appropriate), exam findings with patient. I have reviewed nursing notes and appropriate previous records.  I feel the patient is safe to be discharged home without further emergent workup. Discussed usual and customary return precautions. Patient and family (if present) verbalize understanding and are comfortable with this plan.  Patient will follow-up with their primary care provider. If they do not have a primary care provider, information for follow-up has been provided to them. All questions have been answered.  ____________________________________________  FINAL CLINICAL IMPRESSION(S) / ED DIAGNOSES  Final diagnoses:  Cough  Right lower quadrant abdominal pain  Non-intractable vomiting with nausea, unspecified vomiting type     MEDICATIONS GIVEN DURING THIS VISIT:  Medications  ondansetron (ZOFRAN-ODT) disintegrating tablet 4 mg (4 mg Oral Given 09/29/16 0942)  benzonatate (TESSALON) capsule 100 mg (100 mg Oral Given 09/29/16 1230)     NEW OUTPATIENT MEDICATIONS STARTED DURING THIS VISIT:  Discharge Medication List as of 09/29/2016 12:07 PM    START taking these medications   Details  benzonatate (TESSALON) 100 MG capsule Take 1 capsule (100  mg total) by mouth every 8 (eight) hours., Starting Tue 09/29/2016, Print    doxycycline (VIBRAMYCIN) 100 MG capsule Take 1 capsule (100 mg total) by mouth 2 (two) times daily., Starting Tue 09/29/2016, Until Tue 10/06/2016, Print    nitrofurantoin, macrocrystal-monohydrate, (MACROBID) 100 MG capsule Take 1 capsule (100 mg total) by mouth 2 (two) times daily., Starting Tue 09/29/2016, Until Sun 10/04/2016, Print    ondansetron (ZOFRAN ODT) 4 MG disintegrating tablet  Take 1 tablet (4 mg total) by mouth every 8 (eight) hours as needed for nausea or vomiting., Starting Tue 09/29/2016, Print          Note:  This document was prepared using Dragon voice recognition software and may include unintentional dictation errors.  Alona Bene, MD Emergency Medicine   Maia Plan, MD 09/29/16 [phone]

## 2016-09-29 NOTE — ED Triage Notes (Signed)
Pt here from home with c/o abd pain and cough that started yesterday , pt also c/o n/v/d

## 2016-09-29 NOTE — ED Notes (Signed)
ED Provider at bedside. 

## 2016-12-21 ENCOUNTER — Encounter (HOSPITAL_COMMUNITY): Payer: Self-pay | Admitting: *Deleted

## 2016-12-21 ENCOUNTER — Emergency Department (HOSPITAL_BASED_OUTPATIENT_CLINIC_OR_DEPARTMENT_OTHER)
Admission: RE | Admit: 2016-12-21 | Discharge: 2016-12-21 | Disposition: A | Discharge status: Home / Self Care | Payer: Medicaid Other | Source: Ambulatory Visit

## 2016-12-21 ENCOUNTER — Emergency Department (HOSPITAL_COMMUNITY)
Admission: EM | Admit: 2016-12-21 | Discharge: 2016-12-21 | Disposition: A | Discharge status: Home / Self Care | Payer: Medicaid Other | Attending: Emergency Medicine | Admitting: Emergency Medicine

## 2016-12-21 DIAGNOSIS — R600 Localized edema: Secondary | ICD-10-CM | POA: Insufficient documentation

## 2016-12-21 DIAGNOSIS — I10 Essential (primary) hypertension: Secondary | ICD-10-CM | POA: Insufficient documentation

## 2016-12-21 DIAGNOSIS — F17210 Nicotine dependence, cigarettes, uncomplicated: Secondary | ICD-10-CM | POA: Insufficient documentation

## 2016-12-21 DIAGNOSIS — R609 Edema, unspecified: Secondary | ICD-10-CM

## 2016-12-21 DIAGNOSIS — Z79899 Other long term (current) drug therapy: Secondary | ICD-10-CM | POA: Insufficient documentation

## 2016-12-21 DIAGNOSIS — R739 Hyperglycemia, unspecified: Secondary | ICD-10-CM | POA: Diagnosis not present

## 2016-12-21 DIAGNOSIS — M7989 Other specified soft tissue disorders: Secondary | ICD-10-CM | POA: Diagnosis not present

## 2016-12-21 DIAGNOSIS — R030 Elevated blood-pressure reading, without diagnosis of hypertension: Secondary | ICD-10-CM | POA: Insufficient documentation

## 2016-12-21 DIAGNOSIS — E876 Hypokalemia: Secondary | ICD-10-CM | POA: Diagnosis not present

## 2016-12-21 LAB — COMPREHENSIVE METABOLIC PANEL
ALT: 13 U/L — AB (ref 14–54)
AST: 18 U/L (ref 15–41)
Albumin: 3.6 g/dL (ref 3.5–5.0)
Alkaline Phosphatase: 92 U/L (ref 38–126)
Anion gap: 8 (ref 5–15)
BILIRUBIN TOTAL: 0.4 mg/dL (ref 0.3–1.2)
BUN: 11 mg/dL (ref 6–20)
CHLORIDE: 109 mmol/L (ref 101–111)
CO2: 22 mmol/L (ref 22–32)
CREATININE: 1.03 mg/dL — AB (ref 0.44–1.00)
Calcium: 8.6 mg/dL — ABNORMAL LOW (ref 8.9–10.3)
Glucose, Bld: 192 mg/dL — ABNORMAL HIGH (ref 65–99)
Potassium: 3.2 mmol/L — ABNORMAL LOW (ref 3.5–5.1)
Sodium: 139 mmol/L (ref 135–145)
TOTAL PROTEIN: 6.6 g/dL (ref 6.5–8.1)

## 2016-12-21 MED ORDER — POTASSIUM CHLORIDE CRYS ER 20 MEQ PO TBCR
40.0000 meq | EXTENDED_RELEASE_TABLET | Freq: Once | ORAL | Status: AC
  Administered 2016-12-21: 40 meq via ORAL
  Filled 2016-12-21: qty 2

## 2016-12-21 MED ORDER — ACETAMINOPHEN 500 MG PO TABS
1000.0000 mg | ORAL_TABLET | Freq: Once | ORAL | Status: AC
  Administered 2016-12-21: 1000 mg via ORAL
  Filled 2016-12-21: qty 2

## 2016-12-21 NOTE — ED Triage Notes (Signed)
Pt reports having headache since yesterday. Had episode of vomiting this am. Recent swelling to both feet, worse on right side. Denies injury. Ambulatory on arrival.

## 2016-12-21 NOTE — ED Notes (Signed)
EDP at bedside  

## 2016-12-21 NOTE — ED Notes (Signed)
Patient transported to Ultrasound at this time. 

## 2016-12-21 NOTE — ED Provider Notes (Signed)
[facility]-EMERGENCY DEPT Provider Note   CSN: [id number] Arrival date & time: 12/21/16  [id number]     History   Chief Complaint Chief Complaint  Patient presents with  . Headache  . Foot Swelling    HPI Melissa Vaughan is a 46 y.o. female.Patient complains of foot swelling, bilateral, right greater than left for one week. No injury no fever no trauma symptoms coming back pain which is worse with weightbearing. Improved with nonweightbearing Also complains of mild frontal headache onset last night, gradually. Pain is mild similar to headaches she gets approximately every 2 weeks. No visual changes. No other associated symptoms.  HPI  Past Medical History:  Diagnosis Date  . Hypertension     There are no active problems to display for this patient.   History reviewed. No pertinent surgical history.  OB History    No data available       Home Medications    Prior to Admission medications   Medication Sig Start Date End Date Taking? Authorizing Provider  benzonatate (TESSALON) 100 MG capsule Take 1 capsule (100 mg total) by mouth every 8 (eight) hours. 09/29/16   Long, Arlyss RepressJoshua G, MD  cephALEXin (KEFLEX) 500 MG capsule Take 1 capsule (500 mg total) by mouth 3 (three) times daily. Patient not taking: Reported on 09/29/2016 11/09/15   Lavera GuiseLiu, Dana Duo, MD  Darunavir Ethanolate (PREZISTA) 800 MG tablet Take 800 mg by mouth daily with breakfast.    [provider]  lisinopril-hydrochlorothiazide (PRINZIDE,ZESTORETIC) 20-12.5 MG tablet Take 1 tablet by mouth daily.    [provider]  ondansetron (ZOFRAN ODT) 4 MG disintegrating tablet Take 1 tablet (4 mg total) by mouth every 8 (eight) hours as needed for nausea or vomiting. 09/29/16   Long, Arlyss RepressJoshua G, MD  ritonavir (NORVIR) 100 MG capsule Take 100 mg by mouth daily.    [provider]    Family History History reviewed. No pertinent family history.  Social History Social History  Substance Use Topics  . Smoking  status: Current Some Day Smoker    Types: Cigarettes  . Smokeless tobacco: Never Used  . Alcohol use No   No illicit drug use  Allergies   Patient has no known allergies.   Review of Systems Review of Systems  Constitutional: Negative.   HENT: Negative.   Respiratory: Negative.   Cardiovascular: Positive for leg swelling.  Gastrointestinal: Negative.   Musculoskeletal: Positive for arthralgias.       Foot pain and swelling  Skin: Negative.   Neurological: Positive for headaches.  Psychiatric/Behavioral: Negative.   All other systems reviewed and are negative.    Physical Exam Updated Vital Signs BP (!) 151/98   Pulse 88   Temp 98.8 F (37.1 C) (Oral)   Resp 17   Ht 5\' 4"  (1.626 m)   Wt 64.9 kg (143 lb)   LMP 12/18/2016   SpO2 100%   BMI 24.55 kg/m   Physical Exam  Constitutional: She is oriented to person, place, and time. She appears well-developed and well-nourished. No distress.  HENT:  Head: Normocephalic and atraumatic.  Eyes: Conjunctivae are normal. Pupils are equal, round, and reactive to light.  Neck: Neck supple. No tracheal deviation present. No thyromegaly present.  Cardiovascular: Normal rate and regular rhythm.   No murmur heard. Pulmonary/Chest: Effort normal and breath sounds normal.  Abdominal: Soft. Bowel sounds are normal. She exhibits no distension. There is no tenderness.  Musculoskeletal: Normal range of motion. She exhibits no edema or tenderness.  Right lower extremity with 2+ edema pretibial and pedal, with corresponding tenderness pitting. Not warm or red. DP pulse 2+. Left lower extremity with trace pretibial and pedal pitting edema. Not red or warm. No tenderness DP pulse 2+  Neurological: She is alert and oriented to person, place, and time. She displays normal reflexes. Coordination normal.  DTRs symmetric bilaterally at knee jerk ankle jerk and biceps was ordered bilaterally  Skin: Skin is warm and dry. No rash noted.    Psychiatric: She has a normal mood and affect.  Nursing note and vitals reviewed.    ED Treatments / Results  Labs (all labs ordered are listed, but only abnormal results are displayed) Labs Reviewed  COMPREHENSIVE METABOLIC PANEL    EKG  EKG Interpretation None       Radiology No results found.  Procedures Procedures (including critical care time)  Medications Ordered in ED Medications  acetaminophen (TYLENOL) tablet 1,000 mg (not administered)    Results for orders placed or performed during the hospital encounter of 12/21/16  Comprehensive metabolic panel  Result Value Ref Range   Sodium 139 135 - 145 mmol/L   Potassium 3.2 (L) 3.5 - 5.1 mmol/L   Chloride 109 101 - 111 mmol/L   CO2 22 22 - 32 mmol/L   Glucose, Bld 192 (H) 65 - 99 mg/dL   BUN 11 6 - 20 mg/dL   Creatinine, Ser 5.36 (H) 0.44 - 1.00 mg/dL   Calcium 8.6 (L) 8.9 - 10.3 mg/dL   Total Protein 6.6 6.5 - 8.1 g/dL   Albumin 3.6 3.5 - 5.0 g/dL   AST 18 15 - 41 U/L   ALT 13 (L) 14 - 54 U/L   Alkaline Phosphatase 92 38 - 126 U/L   Total Bilirubin 0.4 0.3 - 1.2 mg/dL   GFR calc non Af Amer >60 >60 mL/min   GFR calc Af Amer >60 >60 mL/min   Anion gap 8 5 - 15   No results found. Initial Impression / Assessment and Plan / ED Course  I have reviewed the triage vital signs and the nursing notes.  Pertinent labs & imaging results that were available during my care of the patient were reviewed by me and considered in my medical decision making (see chart for details).     10 AM reports some improvement of pain after treatment with Tylenol. She is alert ambulatory without difficulty. Plan potassium chloride 40 mEq by mouth prior to discharge. Elevate leg. Follow up with PMD. Blood pressure recheck. Suggest hemoglobin A1c.  Final Clinical Impressions(s) / ED Diagnoses  Diagnosis #1 peripheral edema #2 hypokalemia #3 hyperglycemia #4 elevated blood pressure Final diagnoses:  None    New  Prescriptions New Prescriptions   No medications on file     Doug Sou, MD 12/21/16 1005

## 2016-12-21 NOTE — Discharge Instructions (Signed)
Take Tylenol as directed for pain. Call your primary care physician today to schedule follow-up appointment for within the next one or 2 weeks. Your medications may need to be adjusted. Ask your doctor to recheck your blood pressure. Today's was mildly elevated at 151/98. Also ask your doctor to check a test known his hemoglobin A1c. You may be borderline diabetic. Today's blood sugar was 192. Your blood potassium was also slightly low at 3.2. We've given you a dose of potassium while here. Return if concern for any reason

## 2016-12-21 NOTE — Progress Notes (Signed)
VASCULAR LAB PRELIMINARY  PRELIMINARY  PRELIMINARY  PRELIMINARY  Bilateral lower extremity venous duplex completed.    Preliminary report:  There is no DVT or SVT noted in the bilateral lower extremities.  There is a small, intact Baker's cyst noted in the left popliteal fossa.   Called results to Dr. Orson EvaJacubowitz  Melissa Vaughan, [facility] CenterCANDACE, RVT 12/21/2016, 9:31 AM

## 2017-01-14 NOTE — Congregational Nurse Program (Signed)
Congregational Nurse Program Note  Date of Encounter: 12/28/2016  Past Medical History: Past Medical History:  Diagnosis Date  . Hypertension     Encounter Details:     CNP Questionnaire - 01/14/17 1600      Patient Demographics   Is this a new or existing patient? Existing   Patient is considered a/an Not Applicable   Race African-American/Black     Patient Assistance   Location of Patient Assistance Not Applicable   Patient's financial/insurance status Low Income;Medicaid   Uninsured Patient ([employer] officer, trade unionCard/Care Connects) No   Patient referred to apply for the following financial assistance Not Applicable   Food insecurities addressed Not Applicable   Transportation assistance No   Assistance securing medications No   Educational Designer, television/film sethealth offerings Behavioral health;Safety     Encounter Details   Primary purpose of visit Chronic Illness/Condition Visit;Safety   Was an Emergency Department visit averted? Not Applicable   Does patient have a medical provider? Yes   Patient referred to Follow up with established PCP   Was a mental health screening completed? (GAINS tool) No   Does patient have dental issues? No   Does patient have vision issues? No   Does your patient have an abnormal blood pressure today? Yes   Since previous encounter, have you referred patient for abnormal blood pressure that resulted in a new diagnosis or medication change? No   Does your patient have an abnormal blood glucose today? No   Since previous encounter, have you referred patient for abnormal blood glucose that resulted in a new diagnosis or medication change? No   Was there a life-saving intervention made? No     Client c/o legs swelling.  States is 4 months pregnant and [facility].  States has a history of a stroke.  States sees her HCP on Thursday (this is Monday).  Encouraged her to keep that appointment

## 2017-01-14 NOTE — Congregational Nurse Program (Signed)
Congregational Nurse Program Note  Date of Encounter: 01/11/2017  Past Medical History: Past Medical History:  Diagnosis Date  . Hypertension     Encounter Details:     CNP Questionnaire - 01/14/17 1600      Patient Demographics   Is this a new or existing patient? Existing   Patient is considered a/an Not Applicable   Race African-American/Black     Patient Assistance   Location of Patient Assistance Not Applicable   Patient's financial/insurance status Low Income;Medicaid   Uninsured Patient ([employer] officer, trade unionCard/Care Connects) No   Patient referred to apply for the following financial assistance Not Applicable   Food insecurities addressed Not Applicable   Transportation assistance No   Assistance securing medications No   Educational Designer, television/film sethealth offerings Behavioral health;Safety     Encounter Details   Primary purpose of visit Chronic Illness/Condition Visit;Safety   Was an Emergency Department visit averted? Not Applicable   Does patient have a medical provider? Yes   Patient referred to Follow up with established PCP   Was a mental health screening completed? (GAINS tool) No   Does patient have dental issues? No   Does patient have vision issues? No   Does your patient have an abnormal blood pressure today? Yes   Since previous encounter, have you referred patient for abnormal blood pressure that resulted in a new diagnosis or medication change? No   Does your patient have an abnormal blood glucose today? No   Since previous encounter, have you referred patient for abnormal blood glucose that resulted in a new diagnosis or medication change? No   Was there a life-saving intervention made? No      Requested B/P check.  160/100.  States will be seeing her PCP in few days.  Instructed client to lay down and rest and have B/P repeated in the AM

## 2017-02-15 ENCOUNTER — Inpatient Hospital Stay (HOSPITAL_COMMUNITY)
Admission: AD | Admit: 2017-02-15 | Discharge: 2017-02-15 | Discharge status: Against Medical Advice | Payer: Medicaid Other | Source: Ambulatory Visit | Attending: Obstetrics & Gynecology | Admitting: Obstetrics & Gynecology

## 2017-02-15 NOTE — MAU Note (Signed)
Patient not in lobby

## 2017-02-17 NOTE — Congregational Nurse Program (Signed)
Congregational Nurse Program Note  Date of Encounter: 02/15/2017  Past Medical History: Past Medical History:  Diagnosis Date  . Hypertension     Encounter Details:     CNP Questionnaire - 02/15/17 1450      Patient Demographics   Is this a new or existing patient? New   Patient is considered a/an Not Applicable   Race African-American/Black     Patient Assistance   Location of Patient Assistance Not Applicable   Patient's financial/insurance status Low Income;Medicaid   Uninsured Patient ([employer] officer, trade unionCard/Care Connects) No   Patient referred to apply for the following financial assistance Not Applicable   Food insecurities addressed Not Applicable   Transportation assistance No   Assistance securing medications No   Educational Designer, television/film sethealth offerings Behavioral health;Safety     Encounter Details   Primary purpose of visit Chronic Illness/Condition Visit;Safety   Was an Emergency Department visit averted? Not Applicable   Does patient have a medical provider? Yes   Patient referred to Follow up with established PCP   Was a mental health screening completed? (GAINS tool) No   Does patient have dental issues? No   Does patient have vision issues? No   Does your patient have an abnormal blood pressure today? Yes   Since previous encounter, have you referred patient for abnormal blood pressure that resulted in a new diagnosis or medication change? No   Does your patient have an abnormal blood glucose today? No   Since previous encounter, have you referred patient for abnormal blood glucose that resulted in a new diagnosis or medication change? No   Was there a life-saving intervention made? No    Requested to have B/P check 160/100.  States is on B/P medication, unclear about her consistency in taking medication.  Also presents with nasal congestion.  Encouraged her to lay down for 1-2 hours to bring B/P down.

## 2017-02-19 ENCOUNTER — Encounter: Payer: Self-pay | Admitting: Pediatric Intensive Care

## 2017-03-24 NOTE — Congregational Nurse Program (Signed)
Congregational Nurse Program Note  Date of Encounter: 02/19/2017  Past Medical History: Past Medical History:  Diagnosis Date  . Hypertension     Encounter Details: Cleint states she is 5 weeks IUP and needs BP check. States she take lisinopril-HCTZ. Encouraged to RTC for BP checks.

## 2018-02-26 ENCOUNTER — Other Ambulatory Visit: Payer: Self-pay

## 2018-02-26 ENCOUNTER — Emergency Department (HOSPITAL_COMMUNITY): Payer: Medicaid Other

## 2018-02-26 ENCOUNTER — Encounter (HOSPITAL_COMMUNITY): Payer: Self-pay | Admitting: Emergency Medicine

## 2018-02-26 ENCOUNTER — Emergency Department (HOSPITAL_COMMUNITY)
Admission: EM | Admit: 2018-02-26 | Discharge: 2018-02-26 | Disposition: A | Discharge status: Home / Self Care | Payer: Medicaid Other | Attending: Emergency Medicine | Admitting: Emergency Medicine

## 2018-02-26 DIAGNOSIS — J029 Acute pharyngitis, unspecified: Secondary | ICD-10-CM | POA: Diagnosis not present

## 2018-02-26 DIAGNOSIS — M542 Cervicalgia: Secondary | ICD-10-CM | POA: Diagnosis not present

## 2018-02-26 DIAGNOSIS — B20 Human immunodeficiency virus [HIV] disease: Secondary | ICD-10-CM | POA: Diagnosis not present

## 2018-02-26 DIAGNOSIS — R05 Cough: Secondary | ICD-10-CM | POA: Diagnosis not present

## 2018-02-26 DIAGNOSIS — F17210 Nicotine dependence, cigarettes, uncomplicated: Secondary | ICD-10-CM | POA: Insufficient documentation

## 2018-02-26 DIAGNOSIS — Z7984 Long term (current) use of oral hypoglycemic drugs: Secondary | ICD-10-CM | POA: Insufficient documentation

## 2018-02-26 DIAGNOSIS — Z79899 Other long term (current) drug therapy: Secondary | ICD-10-CM | POA: Diagnosis not present

## 2018-02-26 DIAGNOSIS — R070 Pain in throat: Secondary | ICD-10-CM | POA: Diagnosis present

## 2018-02-26 DIAGNOSIS — R110 Nausea: Secondary | ICD-10-CM | POA: Diagnosis not present

## 2018-02-26 DIAGNOSIS — I10 Essential (primary) hypertension: Secondary | ICD-10-CM | POA: Diagnosis not present

## 2018-02-26 HISTORY — DX: Human immunodeficiency virus (HIV) disease: B20

## 2018-02-26 LAB — URINALYSIS, ROUTINE W REFLEX MICROSCOPIC
BILIRUBIN URINE: NEGATIVE
GLUCOSE, UA: NEGATIVE mg/dL
Ketones, ur: NEGATIVE mg/dL
LEUKOCYTES UA: NEGATIVE
NITRITE: POSITIVE — AB
PH: 6 (ref 5.0–8.0)
Protein, ur: NEGATIVE mg/dL
Specific Gravity, Urine: 1.012 (ref 1.005–1.030)

## 2018-02-26 LAB — I-STAT BETA HCG BLOOD, ED (MC, WL, AP ONLY): I-stat hCG, quantitative: <5 m[IU]/mL (ref <5)

## 2018-02-26 LAB — CBC
HCT: 41.3 % (ref 36.0–46.0)
Hemoglobin: 13.1 g/dL (ref 12.0–15.0)
MCH: 28.1 pg (ref 26.0–34.0)
MCHC: 31.7 g/dL (ref 30.0–36.0)
MCV: 88.6 fL (ref 78.0–100.0)
PLATELETS: 243 10*3/uL (ref 150–400)
RBC: 4.66 MIL/uL (ref 3.87–5.11)
RDW: 13.1 % (ref 11.5–15.5)
WBC: 8 10*3/uL (ref 4.0–10.5)

## 2018-02-26 LAB — COMPREHENSIVE METABOLIC PANEL
ALT: 12 U/L (ref 0–44)
AST: 14 U/L — ABNORMAL LOW (ref 15–41)
Albumin: 4.2 g/dL (ref 3.5–5.0)
Alkaline Phosphatase: 114 U/L (ref 38–126)
Anion gap: 10 (ref 5–15)
BILIRUBIN TOTAL: 0.7 mg/dL (ref 0.3–1.2)
BUN: 11 mg/dL (ref 6–20)
CO2: 22 mmol/L (ref 22–32)
CREATININE: 1.03 mg/dL — AB (ref 0.44–1.00)
Calcium: 9.2 mg/dL (ref 8.9–10.3)
Chloride: 107 mmol/L (ref 98–111)
Glucose, Bld: 115 mg/dL — ABNORMAL HIGH (ref 70–99)
Potassium: 3.6 mmol/L (ref 3.5–5.1)
Sodium: 139 mmol/L (ref 135–145)
TOTAL PROTEIN: 8.1 g/dL (ref 6.5–8.1)

## 2018-02-26 LAB — GROUP A STREP BY PCR: GROUP A STREP BY PCR: NOT DETECTED

## 2018-02-26 LAB — LIPASE, BLOOD: Lipase: 31 U/L (ref 11–51)

## 2018-02-26 MED ORDER — IOHEXOL 300 MG/ML  SOLN
75.0000 mL | Freq: Once | INTRAMUSCULAR | Status: AC | PRN
  Administered 2018-02-26: 75 mL via INTRAVENOUS

## 2018-02-26 MED ORDER — FENTANYL CITRATE (PF) 100 MCG/2ML IJ SOLN
50.0000 ug | Freq: Once | INTRAMUSCULAR | Status: AC
  Administered 2018-02-26: 50 ug via INTRAVENOUS
  Filled 2018-02-26: qty 2

## 2018-02-26 MED ORDER — SODIUM CHLORIDE 0.9 % IV BOLUS
1000.0000 mL | Freq: Once | INTRAVENOUS | Status: AC
  Administered 2018-02-26: 1000 mL via INTRAVENOUS

## 2018-02-26 MED ORDER — KETOROLAC TROMETHAMINE 30 MG/ML IJ SOLN
30.0000 mg | Freq: Once | INTRAMUSCULAR | Status: AC
  Administered 2018-02-26: 30 mg via INTRAVENOUS
  Filled 2018-02-26: qty 1

## 2018-02-26 MED ORDER — ONDANSETRON HCL 4 MG/2ML IJ SOLN
4.0000 mg | Freq: Once | INTRAMUSCULAR | Status: AC
  Administered 2018-02-26: 4 mg via INTRAVENOUS
  Filled 2018-02-26: qty 2

## 2018-02-26 MED ORDER — ONDANSETRON 4 MG PO TBDP: 4.0000 mg | ORAL_TABLET | Freq: Three times a day (TID) | ORAL | 0 refills | Status: DC | PRN

## 2018-02-26 MED ORDER — DEXAMETHASONE SODIUM PHOSPHATE 10 MG/ML IJ SOLN
10.0000 mg | Freq: Once | INTRAMUSCULAR | Status: AC
  Administered 2018-02-26: 10 mg via INTRAVENOUS
  Filled 2018-02-26: qty 1

## 2018-02-26 MED ORDER — HYDROCODONE-ACETAMINOPHEN 5-325 MG PO TABS: 1.0000 | ORAL_TABLET | Freq: Four times a day (QID) | ORAL | 0 refills | Status: DC | PRN

## 2018-02-26 NOTE — ED Triage Notes (Signed)
Pt reports n/v/d, sore throat and productive cough. Denies fevers or chills.

## 2018-02-26 NOTE — ED Notes (Signed)
Pt stable, ambulatory, states understanding of discharge instructions 

## 2018-02-26 NOTE — ED Notes (Signed)
Patient transported to X-ray 

## 2018-02-26 NOTE — ED Provider Notes (Signed)
[facility]   CSN: [id number] Arrival date & time: 02/26/18  [id number]     History   Chief Complaint Chief Complaint  Patient presents with  . Nausea  . Sore Throat    HPI Melissa PonderMichelle Vaughan is a 47 y.o. female.  HPI Patient presents with sore throat cough.  Has had nausea with vomiting.  States the pain began on her right side of the throat last night.  No fevers.  Has had decreased appetite.  States she feels bad all over.  She has HIV but is compliant on her medications and well controlled.  No sick contacts.  No diarrhea. Past Medical History:  Diagnosis Date  . Hypertension     There are no active problems to display for this patient.   History reviewed. No pertinent surgical history.   OB History   None      Home Medications    Prior to Admission medications   Medication Sig Start Date End Date Taking? Authorizing Provider  atorvastatin (LIPITOR) 40 MG tablet Take 20 mg by mouth daily. 04/12/17  Yes [provider]  Darunavir Ethanolate (PREZISTA) 800 MG tablet Take 800 mg by mouth daily with breakfast.   Yes [provider]  darunavir-cobicistat (PREZCOBIX) 800-150 MG tablet Take 1 tablet by mouth daily. 12/01/17  Yes [provider]  emtricitabine-tenofovir AF (DESCOVY) 200-25 MG tablet Take 1 tablet by mouth daily. 12/01/17  Yes [provider]  lisinopril-hydrochlorothiazide (PRINZIDE,ZESTORETIC) 20-12.5 MG tablet Take 1 tablet by mouth daily.   Yes [provider]  metFORMIN (GLUCOPHAGE) 500 MG tablet Take 500 tablets by mouth 2 (two) times daily. 02/08/18  Yes [provider]  ritonavir (NORVIR) 100 MG capsule Take 100 mg by mouth daily.   Yes [provider]  valACYclovir (VALTREX) 500 MG tablet Take 1,000 mg by mouth daily. 06/04/15  Yes [provider]  benzonatate (TESSALON) 100 MG capsule Take 1 capsule (100 mg total) by mouth every 8  (eight) hours. Patient not taking: Reported on 02/26/2018 09/29/16   Long, Arlyss RepressJoshua G, MD  HYDROcodone-acetaminophen (NORCO/VICODIN) 5-325 MG tablet Take 1-2 tablets by mouth every 6 (six) hours as needed. 02/26/18   Benjiman CorePickering, Adell Koval, MD  ondansetron (ZOFRAN-ODT) 4 MG disintegrating tablet Take 1 tablet (4 mg total) by mouth every 8 (eight) hours as needed for nausea or vomiting. 02/26/18   Benjiman CorePickering, Himmat Enberg, MD    Family History History reviewed. No pertinent family history.  Social History Social History   Tobacco Use  . Smoking status: Current Some Day Smoker    Types: Cigarettes  . Smokeless tobacco: Never Used  Substance Use Topics  . Alcohol use: No  . Drug use: No     Allergies   Patient has no known allergies.   Review of Systems Review of Systems  Constitutional: Positive for appetite change.  HENT: Positive for sore throat and trouble swallowing.   Respiratory: Positive for cough.   Gastrointestinal: Negative for abdominal pain.  Genitourinary: Negative for flank pain.  Musculoskeletal: Negative for back pain.  Skin: Negative for rash.  Neurological: Negative for tremors.  Hematological: Negative for adenopathy.  Psychiatric/Behavioral: Negative for confusion.     Physical Exam Updated Vital Signs BP (!) 183/94   Pulse 74   Temp 98.4 F (36.9 C) (Oral)   Resp 18   LMP 02/16/2018 (Approximate)   SpO2 97%   Physical Exam  Constitutional: She appears well-developed.  HENT:  Head: Atraumatic.  Difficult to visualize posterior pharynx.  Left side visualized little more than the right.  No exudate but not able to see tonsils.  No peritonsillar swelling on left side.  Unable to visualize right side even with tongue depressor.  Patient is spitting out her secretions.  Neck:  Anterior cervical lymphadenopathy worse on right side.  Cardiovascular: Normal rate.  Pulmonary/Chest: Effort normal. She has no wheezes. She exhibits no tenderness.  Abdominal: Soft.    Neurological: She is alert.  Skin: Skin is warm. Capillary refill takes less than 2 seconds.     ED Treatments / Results  Labs (all labs ordered are listed, but only abnormal results are displayed) Labs Reviewed  COMPREHENSIVE METABOLIC PANEL - Abnormal; Notable for the following components:      Result Value   Glucose, Bld 115 (*)    Creatinine, Ser 1.03 (*)    AST 14 (*)    All other components within normal limits  URINALYSIS, ROUTINE W REFLEX MICROSCOPIC - Abnormal; Notable for the following components:   APPearance HAZY (*)    Hgb urine dipstick SMALL (*)    Nitrite POSITIVE (*)    Bacteria, UA RARE (*)    All other components within normal limits  GROUP A STREP BY PCR  LIPASE, BLOOD  CBC  I-STAT BETA HCG BLOOD, ED ([facility], [facility], [facility] ONLY)    EKG None  Radiology Dg Chest 2 View  Result Date: 02/26/2018 CLINICAL DATA:  Sore throat, productive cough since yesterday EXAM: CHEST - 2 VIEW COMPARISON:  09/29/2016 FINDINGS: The heart size and mediastinal contours are within normal limits. Both lungs are clear. The visualized skeletal structures are unremarkable. IMPRESSION: No active cardiopulmonary disease. Electronically Signed   By: Elige Ko   On: 02/26/2018 14:33   Ct Soft Tissue Neck W Contrast  Result Date: 02/26/2018 CLINICAL DATA:  Right-sided neck pain beginning yesterday. Difficulty swallowing and talking. Sore throat/stridor, epiglottis or tonsillitis suspected. EXAM: CT NECK WITH CONTRAST TECHNIQUE: Multidetector CT imaging of the neck was performed using the standard protocol following the bolus administration of intravenous contrast. CONTRAST:  75mL OMNIPAQUE IOHEXOL 300 MG/ML  SOLN COMPARISON:  None. FINDINGS: Pharynx and larynx: No focal mucosal or submucosal lesions are present. Nasopharynx is within normal limits. Soft palate is normal. There is mild prominence of the palatine tonsils bilaterally. No discrete mass or fluid collection is present. The epiglottis is  normal. Vocal cords are midline and symmetric. Trachea is within normal limits. Salivary glands: The submandibular and parotid glands are normal. Thyroid: Normal. Lymph nodes: A left submandibular lymph node measures 15 x 11 mm. The largest right submandibular lymph node is 10 x 7 mm. Mildly enlarged submental lymph nodes are present bilaterally. Left greater than right level 2 lymph nodes are enlarged. Vascular: Negative. Limited intracranial: Negative. Visualized orbits: Globes and orbits are within normal limits. Mastoids and visualized paranasal sinuses: Mild mucosal thickening is present right sphenoid sinus. Minimal mucosal thickening is present in the inferior right maxillary sinus. The remaining paranasal sinuses and the mastoid air cells are clear. Skeleton: Vertebral body heights alignment are maintained. A severe overbite is noted. Teeth are otherwise unremarkable. Upper chest: The lung apices are clear. Thoracic inlet is within normal limits. IMPRESSION: 1. Mild prominence of palatine tonsils and adenoid tissue compatible with pharyngitis. 2. Reactive type level 1 and level 2 lymph nodes bilaterally. 3. No discrete abscess or suppurative node. Electronically Signed   By: Marin Roberts M.D.   On: 02/26/2018 15:32  Procedures Procedures (including critical care time)  Medications Ordered in ED Medications  sodium chloride 0.9 % bolus 1,000 mL (0 mLs Intravenous Stopped 02/26/18 1219)  ondansetron (ZOFRAN) injection 4 mg (4 mg Intravenous Given 02/26/18 0920)  fentaNYL (SUBLIMAZE) injection 50 mcg (50 mcg Intravenous Given 02/26/18 0921)  sodium chloride 0.9 % bolus 1,000 mL (0 mLs Intravenous Stopped 02/26/18 1554)  ketorolac (TORADOL) 30 MG/ML injection 30 mg (30 mg Intravenous Given 02/26/18 1340)  iohexol (OMNIPAQUE) 300 MG/ML solution 75 mL (75 mLs Intravenous Contrast Given 02/26/18 1434)  dexamethasone (DECADRON) injection 10 mg (10 mg Intravenous Given 02/26/18 1555)     Initial  Impression / Assessment and Plan / ED Course  I have reviewed the triage vital signs and the nursing notes.  Pertinent labs & imaging results that were available during my care of the patient were reviewed by me and considered in my medical decision making (see chart for details).     Patient with sore throat.  Unable to visualize posterior pharynx well.  CT scan done and it showed a pharyngitis.  Negative strep.  No fever.  Will treat symptomatically.  HIV but reassuring CD4.  Will discharge home.  Final Clinical Impressions(s) / ED Diagnoses   Final diagnoses:  Pharyngitis, unspecified etiology    ED Discharge Orders         Ordered    HYDROcodone-acetaminophen (NORCO/VICODIN) 5-325 MG tablet  Every 6 hours PRN     02/26/18 1552    ondansetron (ZOFRAN-ODT) 4 MG disintegrating tablet  Every 8 hours PRN     02/26/18 1552           Benjiman Core, MD 02/26/18 1600

## 2018-10-14 DIAGNOSIS — I639 Cerebral infarction, unspecified: Secondary | ICD-10-CM | POA: Insufficient documentation

## 2018-10-31 DIAGNOSIS — E119 Type 2 diabetes mellitus without complications: Secondary | ICD-10-CM

## 2018-10-31 HISTORY — DX: Type 2 diabetes mellitus without complications: E11.9

## 2019-11-01 DIAGNOSIS — N946 Dysmenorrhea, unspecified: Secondary | ICD-10-CM | POA: Insufficient documentation

## 2019-11-13 DIAGNOSIS — M6208 Separation of muscle (nontraumatic), other site: Secondary | ICD-10-CM | POA: Insufficient documentation

## 2020-02-07 ENCOUNTER — Encounter (HOSPITAL_COMMUNITY): Payer: Self-pay | Admitting: Emergency Medicine

## 2020-02-07 ENCOUNTER — Other Ambulatory Visit: Payer: Self-pay

## 2020-02-07 ENCOUNTER — Emergency Department (HOSPITAL_COMMUNITY)
Admission: EM | Admit: 2020-02-07 | Discharge: 2020-02-08 | Disposition: A | Discharge status: Home / Self Care | Payer: Medicaid Other | Attending: Emergency Medicine | Admitting: Emergency Medicine

## 2020-02-07 DIAGNOSIS — R1030 Lower abdominal pain, unspecified: Secondary | ICD-10-CM | POA: Diagnosis not present

## 2020-02-07 DIAGNOSIS — R1110 Vomiting, unspecified: Secondary | ICD-10-CM | POA: Insufficient documentation

## 2020-02-07 DIAGNOSIS — Z5321 Procedure and treatment not carried out due to patient leaving prior to being seen by health care provider: Secondary | ICD-10-CM | POA: Insufficient documentation

## 2020-02-07 LAB — CBC
HCT: 44.4 % (ref 36.0–46.0)
Hemoglobin: 14.4 g/dL (ref 12.0–15.0)
MCH: 28.6 pg (ref 26.0–34.0)
MCHC: 32.4 g/dL (ref 30.0–36.0)
MCV: 88.3 fL (ref 80.0–100.0)
Platelets: 294 10*3/uL (ref 150–400)
RBC: 5.03 MIL/uL (ref 3.87–5.11)
RDW: 13.2 % (ref 11.5–15.5)
WBC: 6.3 10*3/uL (ref 4.0–10.5)
nRBC: 0 % (ref 0.0–0.2)

## 2020-02-07 MED ORDER — SODIUM CHLORIDE 0.9% FLUSH: 3.0000 mL | Freq: Once | INTRAVENOUS | Status: DC

## 2020-02-07 NOTE — ED Triage Notes (Signed)
Patient reports mid/low abdominal pain with emesis x2 this evening , no fever or diarrhea .

## 2020-02-08 ENCOUNTER — Encounter (HOSPITAL_COMMUNITY): Payer: Self-pay | Admitting: Emergency Medicine

## 2020-02-08 LAB — URINALYSIS, ROUTINE W REFLEX MICROSCOPIC
Bilirubin Urine: NEGATIVE
Glucose, UA: >=500 mg/dL — AB
Ketones, ur: NEGATIVE mg/dL
Leukocytes,Ua: NEGATIVE
Nitrite: NEGATIVE
Protein, ur: 30 mg/dL — AB
Specific Gravity, Urine: 1.02 (ref 1.005–1.030)
pH: 6 (ref 5.0–8.0)

## 2020-02-08 LAB — COMPREHENSIVE METABOLIC PANEL
ALT: 11 U/L (ref 0–44)
AST: 12 U/L — ABNORMAL LOW (ref 15–41)
Albumin: 4.2 g/dL (ref 3.5–5.0)
Alkaline Phosphatase: 135 U/L — ABNORMAL HIGH (ref 38–126)
Anion gap: 9 (ref 5–15)
BUN: 10 mg/dL (ref 6–20)
CO2: 23 mmol/L (ref 22–32)
Calcium: 9.5 mg/dL (ref 8.9–10.3)
Chloride: 104 mmol/L (ref 98–111)
Creatinine, Ser: 1.21 mg/dL — ABNORMAL HIGH (ref 0.44–1.00)
GFR calc Af Amer: >60 mL/min (ref >60)
GFR calc non Af Amer: 53 mL/min — ABNORMAL LOW (ref >60)
Glucose, Bld: 251 mg/dL — ABNORMAL HIGH (ref 70–99)
Potassium: 3.7 mmol/L (ref 3.5–5.1)
Sodium: 136 mmol/L (ref 135–145)
Total Bilirubin: 0.4 mg/dL (ref 0.3–1.2)
Total Protein: 8.4 g/dL — ABNORMAL HIGH (ref 6.5–8.1)

## 2020-02-08 LAB — LIPASE, BLOOD: Lipase: 32 U/L (ref 11–51)

## 2020-02-08 LAB — I-STAT BETA HCG BLOOD, ED (MC, WL, AP ONLY): I-stat hCG, quantitative: <5 m[IU]/mL (ref <5)

## 2020-02-08 LAB — URINALYSIS, MICROSCOPIC (REFLEX)

## 2020-02-08 NOTE — ED Notes (Signed)
Pt daughter Lanora Manis says call [phone] when pt is ready for discharge

## 2020-10-31 ENCOUNTER — Emergency Department (HOSPITAL_COMMUNITY)
Admission: EM | Admit: 2020-10-31 | Discharge: 2020-10-31 | Disposition: A | Discharge status: Home / Self Care | Payer: Medicaid Other | Attending: Emergency Medicine | Admitting: Emergency Medicine

## 2020-10-31 ENCOUNTER — Emergency Department (HOSPITAL_COMMUNITY): Payer: Medicaid Other

## 2020-10-31 ENCOUNTER — Other Ambulatory Visit: Payer: Self-pay

## 2020-10-31 DIAGNOSIS — I10 Essential (primary) hypertension: Secondary | ICD-10-CM | POA: Insufficient documentation

## 2020-10-31 DIAGNOSIS — R1031 Right lower quadrant pain: Secondary | ICD-10-CM

## 2020-10-31 DIAGNOSIS — E119 Type 2 diabetes mellitus without complications: Secondary | ICD-10-CM | POA: Insufficient documentation

## 2020-10-31 DIAGNOSIS — Z7984 Long term (current) use of oral hypoglycemic drugs: Secondary | ICD-10-CM | POA: Diagnosis not present

## 2020-10-31 DIAGNOSIS — Z21 Asymptomatic human immunodeficiency virus [HIV] infection status: Secondary | ICD-10-CM | POA: Insufficient documentation

## 2020-10-31 DIAGNOSIS — M542 Cervicalgia: Secondary | ICD-10-CM | POA: Diagnosis not present

## 2020-10-31 DIAGNOSIS — F17210 Nicotine dependence, cigarettes, uncomplicated: Secondary | ICD-10-CM | POA: Insufficient documentation

## 2020-10-31 DIAGNOSIS — Z79899 Other long term (current) drug therapy: Secondary | ICD-10-CM | POA: Diagnosis not present

## 2020-10-31 DIAGNOSIS — R109 Unspecified abdominal pain: Secondary | ICD-10-CM | POA: Insufficient documentation

## 2020-10-31 DIAGNOSIS — R200 Anesthesia of skin: Secondary | ICD-10-CM | POA: Insufficient documentation

## 2020-10-31 DIAGNOSIS — R519 Headache, unspecified: Secondary | ICD-10-CM | POA: Insufficient documentation

## 2020-10-31 LAB — DIFFERENTIAL
Abs Immature Granulocytes: 0.03 10*3/uL (ref 0.00–0.07)
Basophils Absolute: 0 10*3/uL (ref 0.0–0.1)
Basophils Relative: 0 %
Eosinophils Absolute: 0.2 10*3/uL (ref 0.0–0.5)
Eosinophils Relative: 3 %
Immature Granulocytes: 0 %
Lymphocytes Relative: 16 %
Lymphs Abs: 1.2 10*3/uL (ref 0.7–4.0)
Monocytes Absolute: 0.5 10*3/uL (ref 0.1–1.0)
Monocytes Relative: 6 %
Neutro Abs: 5.5 10*3/uL (ref 1.7–7.7)
Neutrophils Relative %: 75 %

## 2020-10-31 LAB — URINALYSIS, ROUTINE W REFLEX MICROSCOPIC
Bilirubin Urine: NEGATIVE
Glucose, UA: 100 mg/dL — AB
Ketones, ur: NEGATIVE mg/dL
Nitrite: NEGATIVE
Protein, ur: 30 mg/dL — AB
Specific Gravity, Urine: >1.03 — ABNORMAL HIGH (ref 1.005–1.030)
pH: 5.5 (ref 5.0–8.0)

## 2020-10-31 LAB — COMPREHENSIVE METABOLIC PANEL
ALT: 10 U/L (ref 0–44)
AST: 16 U/L (ref 15–41)
Albumin: 4 g/dL (ref 3.5–5.0)
Alkaline Phosphatase: 121 U/L (ref 38–126)
Anion gap: 5 (ref 5–15)
BUN: 13 mg/dL (ref 6–20)
CO2: 20 mmol/L — ABNORMAL LOW (ref 22–32)
Calcium: 8.5 mg/dL — ABNORMAL LOW (ref 8.9–10.3)
Chloride: 113 mmol/L — ABNORMAL HIGH (ref 98–111)
Creatinine, Ser: 0.95 mg/dL (ref 0.44–1.00)
GFR, Estimated: >60 mL/min (ref >60)
Glucose, Bld: 173 mg/dL — ABNORMAL HIGH (ref 70–99)
Potassium: 3.9 mmol/L (ref 3.5–5.1)
Sodium: 138 mmol/L (ref 135–145)
Total Bilirubin: 0.6 mg/dL (ref 0.3–1.2)
Total Protein: 7.4 g/dL (ref 6.5–8.1)

## 2020-10-31 LAB — URINALYSIS, MICROSCOPIC (REFLEX)

## 2020-10-31 LAB — CBC
HCT: 42.6 % (ref 36.0–46.0)
Hemoglobin: 14 g/dL (ref 12.0–15.0)
MCH: 29 pg (ref 26.0–34.0)
MCHC: 32.9 g/dL (ref 30.0–36.0)
MCV: 88.4 fL (ref 80.0–100.0)
Platelets: 252 10*3/uL (ref 150–400)
RBC: 4.82 MIL/uL (ref 3.87–5.11)
RDW: 12.8 % (ref 11.5–15.5)
WBC: 7.4 10*3/uL (ref 4.0–10.5)
nRBC: 0 % (ref 0.0–0.2)

## 2020-10-31 LAB — I-STAT CHEM 8, ED
BUN: 16 mg/dL (ref 6–20)
Calcium, Ion: 1.16 mmol/L (ref 1.15–1.40)
Chloride: 111 mmol/L (ref 98–111)
Creatinine, Ser: 0.8 mg/dL (ref 0.44–1.00)
Glucose, Bld: 172 mg/dL — ABNORMAL HIGH (ref 70–99)
HCT: 42 % (ref 36.0–46.0)
Hemoglobin: 14.3 g/dL (ref 12.0–15.0)
Potassium: 3.9 mmol/L (ref 3.5–5.1)
Sodium: 142 mmol/L (ref 135–145)
TCO2: 22 mmol/L (ref 22–32)

## 2020-10-31 LAB — I-STAT BETA HCG BLOOD, ED (MC, WL, AP ONLY): I-stat hCG, quantitative: <5 m[IU]/mL (ref <5)

## 2020-10-31 LAB — RAPID URINE DRUG SCREEN, HOSP PERFORMED
Amphetamines: NOT DETECTED
Barbiturates: NOT DETECTED
Benzodiazepines: NOT DETECTED
Cocaine: NOT DETECTED
Opiates: POSITIVE — AB
Tetrahydrocannabinol: POSITIVE — AB

## 2020-10-31 LAB — PROTIME-INR
INR: 1 (ref 0.8–1.2)
Prothrombin Time: 13 seconds (ref 11.4–15.2)

## 2020-10-31 LAB — APTT: aPTT: 28 seconds (ref 24–36)

## 2020-10-31 LAB — ETHANOL: Alcohol, Ethyl (B): <10 mg/dL (ref <10)

## 2020-10-31 MED ORDER — MORPHINE SULFATE (PF) 4 MG/ML IV SOLN
4.0000 mg | Freq: Once | INTRAVENOUS | Status: AC
  Administered 2020-10-31: 4 mg via INTRAVENOUS
  Filled 2020-10-31: qty 1

## 2020-10-31 MED ORDER — MORPHINE SULFATE (PF) 4 MG/ML IV SOLN
4.0000 mg | Freq: Once | INTRAVENOUS | Status: AC
Start: 2020-10-31 — End: 2020-10-31
  Administered 2020-10-31: 4 mg via INTRAVENOUS
  Filled 2020-10-31: qty 1

## 2020-10-31 MED ORDER — IOHEXOL 300 MG/ML  SOLN
100.0000 mL | Freq: Once | INTRAMUSCULAR | Status: AC | PRN
  Administered 2020-10-31: 100 mL via INTRAVENOUS

## 2020-10-31 MED ORDER — DICYCLOMINE HCL 20 MG PO TABS: 20.0000 mg | ORAL_TABLET | Freq: Three times a day (TID) | ORAL | 0 refills | Status: AC | PRN

## 2020-10-31 NOTE — ED Provider Notes (Signed)
[facility] EMERGENCY DEPARTMENT Provider Note   CSN: [id number] Arrival date & time: 10/31/20  1214     History Chief Complaint  Patient presents with  . Headache       . Hypertension    Melissa Vaughan is a 50 y.o. female.  The history is provided by the patient and medical records. No language interpreter was used.  Headache Hypertension Associated symptoms include headaches.     50 year old female significant history of hypertension, diabetes, HIV, prior stroke brought here via EMS from home for complaints of headache and neck pain.  Patient report less than hour ago she was having some abdominal discomfort which she described as pain in her abdomen that radiates towards her back.  She went to stand up to use the bathroom when she noticed that her left thigh and leg was hurting and numb.  And she also report having acute onset of throbbing headache.  She reports symptoms felt similar to prior stroke that she had several years back.  She mentions symptoms seem to be improving without any specific treatment.  She still endorse headache, and abdominal pain.  No nausea vomiting or diarrhea no chest pain or trouble breathing denies any recent injury or heavy lifting. EMS report pt was hypertensive upon their arrival with BP of 208/118.  She reports she is compliant with her medications but unable to recall last viral load or CD4 count.  States she has not been seen by infectious disease specialist for many years.  She denies any recent drugs alcohol use.    Past Medical History:  Diagnosis Date  . HIV (human immunodeficiency virus infection) (HCC)   . Hypertension   . Type 2 diabetes mellitus without complication, without long-term current use of insulin (HCC) 10/31/2018    Patient Active Problem List   Diagnosis Date Noted  . Type 2 diabetes mellitus without complication, without long-term current use of insulin (HCC) 10/31/2018  . CVA (cerebral vascular  accident) (HCC) 10/14/2018  . Chronic abdominal pain 07/23/2016  . Mood disorder (HCC) 04/09/2016  . Human immunodeficiency virus disease (HCC) 04/29/2011  . Essential hypertension 04/29/2011    No past surgical history on file.   OB History   No obstetric history on file.     No family history on file.  Social History   Tobacco Use  . Smoking status: Current Some Day Smoker    Types: Cigarettes  . Smokeless tobacco: Never Used  Substance Use Topics  . Alcohol use: No  . Drug use: No    Home Medications Prior to Admission medications   Medication Sig Start Date End Date Taking? Authorizing Provider  atorvastatin (LIPITOR) 40 MG tablet Take 20 mg by mouth daily. 04/12/17   [provider]  benzonatate (TESSALON) 100 MG capsule Take 1 capsule (100 mg total) by mouth every 8 (eight) hours. Patient not taking: Reported on 02/26/2018 09/29/16   Long, Arlyss Repress, MD  Darunavir Ethanolate (PREZISTA) 800 MG tablet Take 800 mg by mouth daily with breakfast.    [provider]  darunavir-cobicistat (PREZCOBIX) 800-150 MG tablet Take 1 tablet by mouth daily. 12/01/17   [provider]  emtricitabine-tenofovir AF (DESCOVY) 200-25 MG tablet Take 1 tablet by mouth daily. 12/01/17   [provider]  HYDROcodone-acetaminophen (NORCO/VICODIN) 5-325 MG tablet Take 1-2 tablets by mouth every 6 (six) hours as needed. 02/26/18   Benjiman Core, MD  lisinopril-hydrochlorothiazide (PRINZIDE,ZESTORETIC) 20-12.5 MG tablet Take 1 tablet by mouth daily.  [provider]  metFORMIN (GLUCOPHAGE) 500 MG tablet Take 500 tablets by mouth 2 (two) times daily. 02/08/18   [provider]  ondansetron (ZOFRAN-ODT) 4 MG disintegrating tablet Take 1 tablet (4 mg total) by mouth every 8 (eight) hours as needed for nausea or vomiting. 02/26/18   Benjiman Core, MD  ritonavir (NORVIR) 100 MG capsule Take 100 mg by mouth daily.    [provider]   valACYclovir (VALTREX) 500 MG tablet Take 1,000 mg by mouth daily. 06/04/15   [provider]    Allergies    Patient has no known allergies.  Review of Systems   Review of Systems  Neurological: Positive for headaches.  All other systems reviewed and are negative.   Physical Exam Updated Vital Signs BP (!) 147/113   Pulse 91   Temp 98.4 F (36.9 C) (Oral)   Resp 14   Ht 5\' 3"  (1.6 m)   Wt 65.8 kg   SpO2 100%   BMI 25.69 kg/m   Physical Exam Vitals and nursing note reviewed.  Constitutional:      General: She is not in acute distress.    Appearance: She is well-developed.  HENT:     Head: Atraumatic.  Eyes:     Conjunctiva/sclera: Conjunctivae normal.  Cardiovascular:     Rate and Rhythm: Normal rate and regular rhythm.     Heart sounds: Normal heart sounds.  Pulmonary:     Effort: Pulmonary effort is normal.     Breath sounds: Normal breath sounds.  Abdominal:     General: Bowel sounds are normal.     Palpations: Abdomen is soft.  Musculoskeletal:        General: Normal range of motion.     Cervical back: Neck supple.     Comments: 5 out of 5 strength to all 4 extremities  Skin:    General: Skin is warm.     Findings: No rash.  Neurological:     Mental Status: She is alert and oriented to person, place, and time.     GCS: GCS eye subscore is 4. GCS verbal subscore is 5. GCS motor subscore is 6.     Comments: Neurologic exam:  Speech clear, pupils equal round reactive to light, extraocular movements intact  Normal peripheral visual fields L facial droop (old) Follows commands, moves all extremities x4, normal strength to bilateral upper and lower extremities at all major muscle groups including grip Sensation normal to light touch  No pronator drift Gait not tested   Psychiatric:        Mood and Affect: Mood normal.     ED Results / Procedures / Treatments   Labs (all labs ordered are listed, but only abnormal results are  displayed) Labs Reviewed  COMPREHENSIVE METABOLIC PANEL - Abnormal; Notable for the following components:      Result Value   Chloride 113 (*)    CO2 20 (*)    Glucose, Bld 173 (*)    Calcium 8.5 (*)    All other components within normal limits  I-STAT CHEM 8, ED - Abnormal; Notable for the following components:   Glucose, Bld 172 (*)    All other components within normal limits  ETHANOL  PROTIME-INR  APTT  CBC  DIFFERENTIAL  RAPID URINE DRUG SCREEN, HOSP PERFORMED  URINALYSIS, ROUTINE W REFLEX MICROSCOPIC  I-STAT BETA HCG BLOOD, ED ([facility], [facility], [facility] ONLY)    EKG None  Radiology CT HEAD WO CONTRAST  Result Date:  10/31/2020 CLINICAL DATA:  Neuro deficit, acute, stroke suspected. Additional history provided: Headache and hypertension. EXAM: CT HEAD WITHOUT CONTRAST TECHNIQUE: Contiguous axial images were obtained from the base of the skull through the vertex without intravenous contrast. COMPARISON:  No pertinent prior exams available for comparison. FINDINGS: Brain: Cerebral volume is normal. There is a lacunar infarct within the right caudate nucleus which is too small to characterize and age-indeterminate (series 3, image 19) (series 5, image 36). Three additional lacunar infarcts are present, two within the right caudate nucleus/corona radiata and one within the left caudate head. These lacunar infarcts appear chronic. There is no acute intracranial hemorrhage. No demarcated cortical infarct. No extra-axial fluid collection. No evidence of intracranial mass. No midline shift. Vascular: No hyperdense vessel. Skull: Normal. Negative for fracture or focal lesion. Sinuses/Orbits: Visualized orbits show no acute finding. Small volume frothy secretions within the right sphenoid sinus. Trace mucosal thickening within the bilateral maxillary sinuses. IMPRESSION: No acute intracranial hemorrhage or acute demarcated cortical infarction. There is a lacunar infarct within the right caudate nucleus which  is too small to characterize and age-indeterminate. Consider brain MRI for further evaluation. Chronic lacunar infarcts are also present within the right caudate nucleus/corona radiata and left caudate head. Mild paranasal sinus disease as described. Correlate for acute sinusitis. Electronically Signed   By: Jackey Loge DO   On: 10/31/2020 13:56    Procedures Procedures   Medications Ordered in ED Medications  morphine 4 MG/ML injection 4 mg (4 mg Intravenous Given 10/31/20 1300)    ED Course  I have reviewed the triage vital signs and the nursing notes.  Pertinent labs & imaging results that were available during my care of the patient were reviewed by me and considered in my medical decision making (see chart for details).    MDM Rules/Calculators/A&P                          BP (!) 149/110   Pulse 92   Temp 98.4 F (36.9 C) (Oral)   Resp (!) 21   Ht 5\' 3"  (1.6 m)   Wt 65.8 kg   SpO2 100%   BMI 25.69 kg/m   Final Clinical Impression(s) / ED Diagnoses Final diagnoses:  None    Rx / DC Orders ED Discharge Orders    None     12:40 PM Patient complaining of abdominal pain earlier today and now complaining of some pain and numbness to her left leg as well as having headache.  She report history of prior stroke and was concerned for recurrence of her stroke.  She does not have any focal neuro deficit on initial exam.  Work-up initiated.  It was reported that patient initial blood pressure was 208/118.  However currently blood pressure is 147/113. Patient appears to be in no acute discomfort.  2:40 PM Labs are reassuring, head CT scan without any acute intracranial hemorrhage or acute demarcated cortical infarction however there is a lacunar infarct within the right caudate nucleus which is too small to characterize and age indeterminant.  Radiologist recommend brain MRI for further evaluation.  Given his significant risk factors of recurrent stroke due to history of prior  stroke, will obtain brain MRI.  3:27 PM Patient signed out to oncoming team who will follow up on MRI and reassess patient.   , PA-C 10/31/20 1528    11/02/20, MD 11/01/20 [phone]

## 2020-10-31 NOTE — ED Notes (Signed)
Pt ambulated to bathroom prior to Morphine administration. Pt ambulated on her own power with 1 assist for safety only. Gait even and steady

## 2020-10-31 NOTE — Discharge Instructions (Signed)
You were seen in the emergency room today with numbness in the leg along with abdominal pain and headache.  Your lab work and MRI did not show evidence of a stroke.  Please continue your home medications.  You are having some abdominal pain and the CT scan of your belly was normal.  I am prescribing some medication called Bentyl to take as needed for pain.  Please follow dosing instructions and follow closely with your primary care doctor by calling tomorrow to schedule the next follow-up appointment ideally within the next week.   Return to the emergency department with any new or suddenly worsening symptoms.

## 2020-10-31 NOTE — ED Triage Notes (Signed)
Pt comes from home via EMS. Called EMS as she thought she was having a stroke due to her headache and left leg pain. Pt thought her speech was slurred but family denies slurred speech. Last know normal 1600 yesterday. EMS reports pt had clear speech upon their arrival, no facial droop or weakness, stroke scale negative. EMS reports HTN upon their arrival with highest 208/118 at most recent. 20g IV established in LAC. Pt a/o x4. Complains of head and left leg pain 6/10 BP 172/108 HR 100 O2 98% RA CBG 172 RR 18

## 2020-10-31 NOTE — ED Notes (Signed)
Patient transported to MRI 

## 2020-10-31 NOTE — ED Notes (Signed)
Patient transported to CT 

## 2020-10-31 NOTE — ED Provider Notes (Signed)
Blood pressure (!) 173/112, pulse 70, temperature 98.4 F (36.9 C), temperature source Oral, resp. rate 14, height 5\' 3"  (1.6 m), weight 65.8 kg, SpO2 100 %.  Assuming care from , PA-C.  In short, Melissa Vaughan is a 50 y.o. female with a chief complaint of Headache (/) and Hypertension .  Refer to the original H&P for additional details.  The current plan of care is to f/u on MRI and reassess.  The patient's MRI brain is reviewed showing chronic changes but no acute infarct.  On reevaluation the patient is complaining more of her abdominal pain.  It seems to be focused in the right lower quadrant and she does have some tenderness on my exam.  Reviewed her lab work showing no leukocytosis and normal kidney function.  She reports not having this pain in the past.  Will perform CT abdomen pelvis to look further for possible acute surgical process such as appendicitis/colitis.  Will reevaluate after CT.  07:10 PM  CT abdomen pelvis negative for acute process.  No findings in the right lower abdomen.  Plan for discharge home with plan for close PCP follow-up and Bentyl as needed for abdominal pain.     54, MD 10/31/20 1910

## 2020-12-23 ENCOUNTER — Ambulatory Visit (HOSPITAL_COMMUNITY)
Admission: EM | Admit: 2020-12-23 | Discharge: 2020-12-23 | Disposition: A | Discharge status: Other Acute Inpatient Hospital | Payer: Medicaid Other

## 2020-12-23 ENCOUNTER — Encounter (HOSPITAL_COMMUNITY): Payer: Self-pay

## 2020-12-23 ENCOUNTER — Other Ambulatory Visit: Payer: Self-pay

## 2020-12-23 ENCOUNTER — Ambulatory Visit (HOSPITAL_BASED_OUTPATIENT_CLINIC_OR_DEPARTMENT_OTHER): Payer: Medicaid Other

## 2020-12-23 ENCOUNTER — Observation Stay (HOSPITAL_COMMUNITY): Payer: Medicaid Other

## 2020-12-23 ENCOUNTER — Inpatient Hospital Stay (HOSPITAL_COMMUNITY)
Admission: EM | Admit: 2020-12-23 | Discharge: 2020-12-25 | DRG: 305 | Disposition: A | Discharge status: Home / Self Care | Payer: Medicaid Other | Attending: Internal Medicine | Admitting: Internal Medicine

## 2020-12-23 ENCOUNTER — Emergency Department (HOSPITAL_COMMUNITY)
Admission: EM | Admit: 2020-12-23 | Discharge: 2020-12-23 | Discharge status: Against Medical Advice | Payer: Medicaid Other

## 2020-12-23 ENCOUNTER — Emergency Department (HOSPITAL_COMMUNITY): Payer: Medicaid Other

## 2020-12-23 DIAGNOSIS — I6389 Other cerebral infarction: Secondary | ICD-10-CM | POA: Diagnosis not present

## 2020-12-23 DIAGNOSIS — I10 Essential (primary) hypertension: Secondary | ICD-10-CM

## 2020-12-23 DIAGNOSIS — I161 Hypertensive emergency: Secondary | ICD-10-CM

## 2020-12-23 DIAGNOSIS — Z716 Tobacco abuse counseling: Secondary | ICD-10-CM

## 2020-12-23 DIAGNOSIS — I160 Hypertensive urgency: Secondary | ICD-10-CM | POA: Diagnosis not present

## 2020-12-23 DIAGNOSIS — F17210 Nicotine dependence, cigarettes, uncomplicated: Secondary | ICD-10-CM | POA: Diagnosis present

## 2020-12-23 DIAGNOSIS — Z20822 Contact with and (suspected) exposure to covid-19: Secondary | ICD-10-CM | POA: Diagnosis present

## 2020-12-23 DIAGNOSIS — I6300 Cerebral infarction due to thrombosis of unspecified precerebral artery: Secondary | ICD-10-CM

## 2020-12-23 DIAGNOSIS — I69392 Facial weakness following cerebral infarction: Secondary | ICD-10-CM

## 2020-12-23 DIAGNOSIS — Z21 Asymptomatic human immunodeficiency virus [HIV] infection status: Secondary | ICD-10-CM | POA: Diagnosis present

## 2020-12-23 DIAGNOSIS — B20 Human immunodeficiency virus [HIV] disease: Secondary | ICD-10-CM | POA: Diagnosis present

## 2020-12-23 DIAGNOSIS — Z79899 Other long term (current) drug therapy: Secondary | ICD-10-CM

## 2020-12-23 DIAGNOSIS — R569 Unspecified convulsions: Secondary | ICD-10-CM | POA: Diagnosis present

## 2020-12-23 DIAGNOSIS — R203 Hyperesthesia: Secondary | ICD-10-CM

## 2020-12-23 DIAGNOSIS — Z7984 Long term (current) use of oral hypoglycemic drugs: Secondary | ICD-10-CM

## 2020-12-23 DIAGNOSIS — R252 Cramp and spasm: Secondary | ICD-10-CM | POA: Diagnosis present

## 2020-12-23 DIAGNOSIS — E785 Hyperlipidemia, unspecified: Secondary | ICD-10-CM | POA: Diagnosis present

## 2020-12-23 DIAGNOSIS — Z7902 Long term (current) use of antithrombotics/antiplatelets: Secondary | ICD-10-CM

## 2020-12-23 DIAGNOSIS — I639 Cerebral infarction, unspecified: Secondary | ICD-10-CM | POA: Diagnosis present

## 2020-12-23 DIAGNOSIS — R519 Headache, unspecified: Secondary | ICD-10-CM

## 2020-12-23 DIAGNOSIS — E119 Type 2 diabetes mellitus without complications: Secondary | ICD-10-CM

## 2020-12-23 LAB — PROTIME-INR
INR: 1 (ref 0.8–1.2)
Prothrombin Time: 13.2 seconds (ref 11.4–15.2)

## 2020-12-23 LAB — GLUCOSE, CAPILLARY
Glucose-Capillary: 135 mg/dL — ABNORMAL HIGH (ref 70–99)
Glucose-Capillary: 168 mg/dL — ABNORMAL HIGH (ref 70–99)

## 2020-12-23 LAB — COMPREHENSIVE METABOLIC PANEL
ALT: 10 U/L (ref 0–44)
AST: 12 U/L — ABNORMAL LOW (ref 15–41)
Albumin: 4.1 g/dL (ref 3.5–5.0)
Alkaline Phosphatase: 110 U/L (ref 38–126)
Anion gap: 9 (ref 5–15)
BUN: 13 mg/dL (ref 6–20)
CO2: 21 mmol/L — ABNORMAL LOW (ref 22–32)
Calcium: 8.9 mg/dL (ref 8.9–10.3)
Chloride: 109 mmol/L (ref 98–111)
Creatinine, Ser: 0.99 mg/dL (ref 0.44–1.00)
GFR, Estimated: >60 mL/min (ref >60)
Glucose, Bld: 141 mg/dL — ABNORMAL HIGH (ref 70–99)
Potassium: 3.6 mmol/L (ref 3.5–5.1)
Sodium: 139 mmol/L (ref 135–145)
Total Bilirubin: 0.5 mg/dL (ref 0.3–1.2)
Total Protein: 7.7 g/dL (ref 6.5–8.1)

## 2020-12-23 LAB — I-STAT CHEM 8, ED
BUN: 14 mg/dL (ref 6–20)
Calcium, Ion: 1.12 mmol/L — ABNORMAL LOW (ref 1.15–1.40)
Chloride: 109 mmol/L (ref 98–111)
Creatinine, Ser: 0.9 mg/dL (ref 0.44–1.00)
Glucose, Bld: 138 mg/dL — ABNORMAL HIGH (ref 70–99)
HCT: 41 % (ref 36.0–46.0)
Hemoglobin: 13.9 g/dL (ref 12.0–15.0)
Potassium: 3.6 mmol/L (ref 3.5–5.1)
Sodium: 143 mmol/L (ref 135–145)
TCO2: 23 mmol/L (ref 22–32)

## 2020-12-23 LAB — DIFFERENTIAL
Abs Immature Granulocytes: 0.02 10*3/uL (ref 0.00–0.07)
Basophils Absolute: 0.1 10*3/uL (ref 0.0–0.1)
Basophils Relative: 1 %
Eosinophils Absolute: 0.3 10*3/uL (ref 0.0–0.5)
Eosinophils Relative: 4 %
Immature Granulocytes: 0 %
Lymphocytes Relative: 33 %
Lymphs Abs: 2.2 10*3/uL (ref 0.7–4.0)
Monocytes Absolute: 0.4 10*3/uL (ref 0.1–1.0)
Monocytes Relative: 5 %
Neutro Abs: 3.7 10*3/uL (ref 1.7–7.7)
Neutrophils Relative %: 57 %

## 2020-12-23 LAB — APTT: aPTT: 26 seconds (ref 24–36)

## 2020-12-23 LAB — RAPID URINE DRUG SCREEN, HOSP PERFORMED
Amphetamines: NOT DETECTED
Barbiturates: NOT DETECTED
Benzodiazepines: NOT DETECTED
Cocaine: NOT DETECTED
Opiates: NOT DETECTED
Tetrahydrocannabinol: POSITIVE — AB

## 2020-12-23 LAB — CBC
HCT: 42.2 % (ref 36.0–46.0)
Hemoglobin: 13.6 g/dL (ref 12.0–15.0)
MCH: 28.6 pg (ref 26.0–34.0)
MCHC: 32.2 g/dL (ref 30.0–36.0)
MCV: 88.8 fL (ref 80.0–100.0)
Platelets: 305 10*3/uL (ref 150–400)
RBC: 4.75 MIL/uL (ref 3.87–5.11)
RDW: 13.2 % (ref 11.5–15.5)
WBC: 6.7 10*3/uL (ref 4.0–10.5)
nRBC: 0 % (ref 0.0–0.2)

## 2020-12-23 LAB — ECHOCARDIOGRAM COMPLETE
AR max vel: 2.19 cm2
AV Area VTI: 1.99 cm2
AV Area mean vel: 2.03 cm2
AV Mean grad: 4 mmHg
AV Peak grad: 7 mmHg
Ao pk vel: 1.32 m/s
Area-P 1/2: 4.49 cm2
Height: 63 in
S' Lateral: 2.7 cm
Weight: 2455.04 oz

## 2020-12-23 LAB — CBG MONITORING, ED: Glucose-Capillary: 132 mg/dL — ABNORMAL HIGH (ref 70–99)

## 2020-12-23 LAB — SARS CORONAVIRUS 2 (TAT 6-24 HRS): SARS Coronavirus 2: NEGATIVE

## 2020-12-23 LAB — I-STAT BETA HCG BLOOD, ED (MC, WL, AP ONLY): I-stat hCG, quantitative: <5 m[IU]/mL (ref <5)

## 2020-12-23 MED ORDER — HYDRALAZINE HCL 25 MG PO TABS
25.0000 mg | ORAL_TABLET | Freq: Four times a day (QID) | ORAL | Status: DC | PRN
  Administered 2020-12-24: 25 mg via ORAL
  Filled 2020-12-23: qty 1

## 2020-12-23 MED ORDER — EMTRICITABINE-TENOFOVIR AF 200-25 MG PO TABS
1.0000 | ORAL_TABLET | Freq: Every day | ORAL | Status: DC
  Administered 2020-12-23 – 2020-12-25 (×3): 1 via ORAL
  Filled 2020-12-23 (×3): qty 1

## 2020-12-23 MED ORDER — HYDROCHLOROTHIAZIDE 12.5 MG PO CAPS
12.5000 mg | ORAL_CAPSULE | Freq: Every day | ORAL | Status: DC
  Administered 2020-12-23 – 2020-12-25 (×3): 12.5 mg via ORAL
  Filled 2020-12-23 (×3): qty 1

## 2020-12-23 MED ORDER — STROKE: EARLY STAGES OF RECOVERY BOOK
Freq: Once | Status: AC
  Filled 2020-12-23: qty 1

## 2020-12-23 MED ORDER — ASPIRIN EC 81 MG PO TBEC
81.0000 mg | DELAYED_RELEASE_TABLET | Freq: Every day | ORAL | Status: DC
  Administered 2020-12-23 – 2020-12-25 (×3): 81 mg via ORAL
  Filled 2020-12-23 (×3): qty 1

## 2020-12-23 MED ORDER — LISINOPRIL-HYDROCHLOROTHIAZIDE 20-12.5 MG PO TABS: 1.0000 | ORAL_TABLET | Freq: Every day | ORAL | Status: DC

## 2020-12-23 MED ORDER — ACETAMINOPHEN 160 MG/5ML PO SOLN: 650.0000 mg | ORAL | Status: DC | PRN

## 2020-12-23 MED ORDER — SODIUM CHLORIDE 0.9 % IV SOLN: INTRAVENOUS | Status: AC

## 2020-12-23 MED ORDER — ACETAMINOPHEN 325 MG PO TABS
650.0000 mg | ORAL_TABLET | ORAL | Status: DC | PRN
  Administered 2020-12-24 – 2020-12-25 (×4): 650 mg via ORAL
  Filled 2020-12-23 (×4): qty 2

## 2020-12-23 MED ORDER — NICOTINE 21 MG/24HR TD PT24
21.0000 mg | MEDICATED_PATCH | Freq: Every day | TRANSDERMAL | Status: DC
  Administered 2020-12-23 – 2020-12-25 (×3): 21 mg via TRANSDERMAL
  Filled 2020-12-23 (×3): qty 1

## 2020-12-23 MED ORDER — LISINOPRIL 20 MG PO TABS
20.0000 mg | ORAL_TABLET | Freq: Every day | ORAL | Status: DC
  Administered 2020-12-23 – 2020-12-25 (×3): 20 mg via ORAL
  Filled 2020-12-23 (×3): qty 1

## 2020-12-23 MED ORDER — ATORVASTATIN CALCIUM 10 MG PO TABS
20.0000 mg | ORAL_TABLET | Freq: Every day | ORAL | Status: DC
  Administered 2020-12-23 – 2020-12-25 (×3): 20 mg via ORAL
  Filled 2020-12-23 (×3): qty 2

## 2020-12-23 MED ORDER — ENOXAPARIN SODIUM 40 MG/0.4ML IJ SOSY
40.0000 mg | PREFILLED_SYRINGE | INTRAMUSCULAR | Status: DC
  Administered 2020-12-23 – 2020-12-24 (×2): 40 mg via SUBCUTANEOUS
  Filled 2020-12-23 (×2): qty 0.4

## 2020-12-23 MED ORDER — LABETALOL HCL 5 MG/ML IV SOLN
20.0000 mg | Freq: Once | INTRAVENOUS | Status: AC
  Administered 2020-12-23: 20 mg via INTRAVENOUS
  Filled 2020-12-23: qty 4

## 2020-12-23 MED ORDER — DOLUTEGRAVIR SODIUM 50 MG PO TABS
50.0000 mg | ORAL_TABLET | Freq: Every day | ORAL | Status: DC
  Administered 2020-12-23 – 2020-12-25 (×3): 50 mg via ORAL
  Filled 2020-12-23 (×3): qty 1

## 2020-12-23 MED ORDER — SODIUM CHLORIDE 0.9% FLUSH
3.0000 mL | Freq: Once | INTRAVENOUS | Status: AC
Start: 2020-12-23 — End: 2020-12-23
  Administered 2020-12-23: 3 mL via INTRAVENOUS

## 2020-12-23 MED ORDER — ACETAMINOPHEN 650 MG RE SUPP: 650.0000 mg | RECTAL | Status: DC | PRN

## 2020-12-23 MED ORDER — LORAZEPAM 2 MG/ML IJ SOLN
INTRAMUSCULAR | Status: AC
  Administered 2020-12-23: 1 mg via INTRAVENOUS
  Filled 2020-12-23: qty 1

## 2020-12-23 MED ORDER — INSULIN ASPART 100 UNIT/ML IJ SOLN
0.0000 [IU] | Freq: Three times a day (TID) | INTRAMUSCULAR | Status: DC
  Administered 2020-12-23 – 2020-12-24 (×2): 2 [IU] via SUBCUTANEOUS
  Administered 2020-12-24: 3 [IU] via SUBCUTANEOUS
  Administered 2020-12-24 – 2020-12-25 (×2): 2 [IU] via SUBCUTANEOUS
  Administered 2020-12-25: 5 [IU] via SUBCUTANEOUS

## 2020-12-23 MED ORDER — LORAZEPAM 2 MG/ML IJ SOLN: 1.0000 mg | Freq: Once | INTRAMUSCULAR | Status: AC

## 2020-12-23 MED ORDER — SENNOSIDES-DOCUSATE SODIUM 8.6-50 MG PO TABS: 1.0000 | ORAL_TABLET | Freq: Every evening | ORAL | Status: DC | PRN

## 2020-12-23 MED ORDER — LEVETIRACETAM IN NACL 1000 MG/100ML IV SOLN
1000.0000 mg | Freq: Once | INTRAVENOUS | Status: AC
  Administered 2020-12-23: 1000 mg via INTRAVENOUS
  Filled 2020-12-23: qty 100

## 2020-12-23 MED ORDER — PERFLUTREN LIPID MICROSPHERE
1.0000 mL | INTRAVENOUS | Status: AC | PRN
  Filled 2020-12-23: qty 10

## 2020-12-23 MED ORDER — CLOPIDOGREL BISULFATE 75 MG PO TABS
75.0000 mg | ORAL_TABLET | Freq: Every day | ORAL | Status: DC
  Administered 2020-12-23 – 2020-12-25 (×3): 75 mg via ORAL
  Filled 2020-12-23 (×3): qty 1

## 2020-12-23 MED ORDER — DARUNAVIR-COBICISTAT 800-150 MG PO TABS: 1.0000 | ORAL_TABLET | Freq: Every day | ORAL | Status: DC

## 2020-12-23 NOTE — ED Triage Notes (Signed)
Pt presents with a headache and states she has been feeling her hands cramp up and has been unable to move her fingers. Pt states she feels numbness on both of her hands. Pt states she feels she is having a stroke again.

## 2020-12-23 NOTE — H&P (Addendum)
History and Physical    Melissa Vaughan UKG:[id number] DOB: [birth year] DOA: 12/23/2020  PCP: Pcp, No (Confirm with patient/family/NH records and if not entered, this has to be entered at [facility] point of entry) Patient coming from: Home  I have personally briefly reviewed patient's old medical records in [facility]  Chief Complaint: Left hand cramping  HPI: Melissa Vaughan is a 50 y.o. female with medical history significant of HIV on HAART, HTN, history of stroke, HLD, IIDM, came with new onset of left forearm and hand cramping.  Patient woke up this morning feeling headache, global ache, denied any blurry vision feeling nauseous.  Then around 1130 but dentist office, patient started to feel left forearm and hands cramping like pain, unable to extend the 5 fingers of left hand, and her blood pressure was significantly elevated with systolic BP > 220. And EMS was called. Her symptoms persistent for about 2 hours " I was really concerned about having another stroke".  As of now, patient still feels soreness of the left forearm and the left palm and 5 fingers, denies any numbness or weakness of the left hand.  Denies any numbness or weakness of any other limbs.  Recently, 46-month ago, PCP added a second BP medication amlodipine to her BP regimen, which she usually takes in the evening.  ED Course: CT head suspect acute right perirolandic infarct near the sensorimotor hand region. CBC BMP within normal limits.  Neurology was consulted diagnosed CVA versus focal seizure.  1 dose of 1000 mg Keppra given in the ED.  MRI pending.  Review of Systems: As per HPI otherwise 14 point review of systems negative.    Past Medical History:  Diagnosis Date  . HIV (human immunodeficiency virus infection) (HCC)   . Hypertension   . Type 2 diabetes mellitus without complication, without long-term current use of insulin (HCC) 10/31/2018    No past surgical history on file.   reports that she has been  smoking cigarettes. She has never used smokeless tobacco. She reports that she does not drink alcohol and does not use drugs.  No Known Allergies  No family history on file.   Prior to Admission medications   Medication Sig Start Date End Date Taking? Authorizing Provider  atorvastatin (LIPITOR) 20 MG tablet Take 20 mg by mouth daily. 04/12/17   [provider]  benzonatate (TESSALON) 100 MG capsule Take 1 capsule (100 mg total) by mouth every 8 (eight) hours. Patient not taking: No sig reported 09/29/16   Long, Arlyss Repress, MD  clopidogrel (PLAVIX) 75 MG tablet Take 75 mg by mouth daily.    [provider]  darunavir-cobicistat (PREZCOBIX) 800-150 MG tablet Take 1 tablet by mouth daily. 12/01/17   [provider]  dicyclomine (BENTYL) 20 MG tablet Take 1 tablet (20 mg total) by mouth 3 (three) times daily as needed for spasms. 10/31/20   Long, Arlyss Repress, MD  dolutegravir (TIVICAY) 50 MG tablet Take 50 mg by mouth daily. 03/29/20   [provider]  emtricitabine-tenofovir AF (DESCOVY) 200-25 MG tablet Take 1 tablet by mouth daily. 12/01/17   [provider]  HYDROcodone-acetaminophen (NORCO/VICODIN) 5-325 MG tablet Take 1-2 tablets by mouth every 6 (six) hours as needed. Patient not taking: No sig reported 02/26/18   Benjiman Core, MD  lisinopril-hydrochlorothiazide (PRINZIDE,ZESTORETIC) 20-12.5 MG tablet Take 1 tablet by mouth daily.    [provider]  metFORMIN (GLUCOPHAGE) 500 MG tablet Take 500 tablets by mouth 2 (two) times daily.  02/08/18   [provider]  ondansetron (ZOFRAN-ODT) 4 MG disintegrating tablet Take 1 tablet (4 mg total) by mouth every 8 (eight) hours as needed for nausea or vomiting. Patient not taking: No sig reported 02/26/18   Benjiman CorePickering, Nathan, MD    Physical Exam: Vitals:   12/23/20 1420 12/23/20 1423 12/23/20 1442 12/23/20 1448  BP: (!) 165/113     Pulse: 68     Resp: 16     Temp:  98.6 F (37 C) 98.6 F  (37 C)   TempSrc:  Oral Oral   SpO2: 100%     Weight:      Height:    5\' 3"  (1.6 m)    Constitutional: NAD, calm, comfortable Vitals:   12/23/20 1420 12/23/20 1423 12/23/20 1442 12/23/20 1448  BP: (!) 165/113     Pulse: 68     Resp: 16     Temp:  98.6 F (37 C) 98.6 F (37 C)   TempSrc:  Oral Oral   SpO2: 100%     Weight:      Height:    5\' 3"  (1.6 m)   Eyes: PERRL, lids and conjunctivae normal ENMT: Mucous membranes are moist. Posterior pharynx clear of any exudate or lesions.Normal dentition.  Neck: normal, supple, no masses, no thyromegaly Respiratory: clear to auscultation bilaterally, no wheezing, no crackles. Normal respiratory effort. No accessory muscle use.  Cardiovascular: Regular rate and rhythm, no murmurs / rubs / gallops. No extremity edema. 2+ pedal pulses. No carotid bruits.  Abdomen: no tenderness, no masses palpated. No hepatosplenomegaly. Bowel sounds positive.  Musculoskeletal: no clubbing / cyanosis. No joint deformity upper and lower extremities. Good ROM, no contractures. Normal muscle tone.  Skin: no rashes, lesions, ulcers. No induration Neurologic: CN 2-12 grossly intact. Sensation intact, DTR normal. Strength 5/5 in all 4.  Psychiatric: Normal judgment and insight. Alert and oriented x 3. Normal mood.     Labs on Admission: I have personally reviewed following labs and imaging studies  CBC: Recent Labs  Lab 12/23/20 1402 12/23/20 1408  WBC 6.7  --   NEUTROABS 3.7  --   HGB 13.6 13.9  HCT 42.2 41.0  MCV 88.8  --   PLT 305  --    Basic Metabolic Panel: Recent Labs  Lab 12/23/20 1402 12/23/20 1408  NA 139 143  K 3.6 3.6  CL 109 109  CO2 21*  --   GLUCOSE 141* 138*  BUN 13 14  CREATININE 0.99 0.90  CALCIUM 8.9  --    GFR: Estimated Creatinine Clearance: 71.6 mL/min (by C-G formula based on SCr of 0.9 mg/dL). Liver Function Tests: Recent Labs  Lab 12/23/20 1402  AST 12*  ALT 10  ALKPHOS 110  BILITOT 0.5  PROT 7.7  ALBUMIN  4.1   No results for input(s): LIPASE, AMYLASE in the last 168 hours. No results for input(s): AMMONIA in the last 168 hours. Coagulation Profile: Recent Labs  Lab 12/23/20 1402  INR 1.0   Cardiac Enzymes: No results for input(s): CKTOTAL, CKMB, CKMBINDEX, TROPONINI in the last 168 hours. BNP (last 3 results) No results for input(s): PROBNP in the last 8760 hours. HbA1C: No results for input(s): HGBA1C in the last 72 hours. CBG: Recent Labs  Lab 12/23/20 1402  GLUCAP 132*   Lipid Profile: No results for input(s): CHOL, HDL, LDLCALC, TRIG, CHOLHDL, LDLDIRECT in the last 72 hours. Thyroid Function Tests: No results for input(s): TSH, T4TOTAL, FREET4, T3FREE, THYROIDAB in the last  72 hours. Anemia Panel: No results for input(s): VITAMINB12, FOLATE, FERRITIN, TIBC, IRON, RETICCTPCT in the last 72 hours. Urine analysis:    Component Value Date/Time   COLORURINE YELLOW 10/31/2020 1236   APPEARANCEUR HAZY (A) 10/31/2020 1236   LABSPEC >1.030 (H) 10/31/2020 1236   PHURINE 5.5 10/31/2020 1236   GLUCOSEU 100 (A) 10/31/2020 1236   HGBUR SMALL (A) 10/31/2020 1236   BILIRUBINUR NEGATIVE 10/31/2020 1236   KETONESUR NEGATIVE 10/31/2020 1236   PROTEINUR 30 (A) 10/31/2020 1236   NITRITE NEGATIVE 10/31/2020 1236   LEUKOCYTESUR MODERATE (A) 10/31/2020 1236    Radiological Exams on Admission: CT HEAD CODE STROKE WO CONTRAST  Result Date: 12/23/2020 CLINICAL DATA:  Code stroke.  Left hand numbness EXAM: CT HEAD WITHOUT CONTRAST TECHNIQUE: Contiguous axial images were obtained from the base of the skull through the vertex without intravenous contrast. COMPARISON:  10/31/2020 FINDINGS: Brain: No acute intracranial hemorrhage or mass effect. Possible new small area of loss of gray-white differentiation in the right perirolandic region near the sensorimotor hand region. Chronic infarcts of the right greater than left basal ganglia and adjacent white matter. Ventricles and sulci are within normal  limits in size and configuration. No extra-axial collection. Vascular: No hyperdense vessel. Skull: Unremarkable. Sinuses/Orbits: No acute abnormality. Other: Mastoid air cells are clear. ASPECTS [facility] Stroke Program Early CT Score) - Ganglionic level infarction (caudate, lentiform nuclei, internal capsule, insula, M1-M3 cortex): 7 - Supraganglionic infarction (M4-M6 cortex): 2 Total score (0-10 with 10 being normal): 9 IMPRESSION: Question of a small acute right perirolandic infarct near the sensorimotor hand region. This is not confirmed in all planes and could reflect volume averaging with the sulcus. No acute intracranial hemorrhage. Stable chronic findings detailed above. These results were communicated to Dr. Pearlean Brownie at 2:15 pm on 12/23/2020 by text page via the [facility] messaging system. Electronically Signed   By: Guadlupe Spanish M.D.   On: 12/23/2020 14:19    EKG: Independently reviewed. Sinus rhythm, no acute ST-T changes  Assessment/Plan Active Problems:   CVA (cerebral vascular accident) (HCC)  (please populate well all problems here in Problem List. (For example, if patient is on BP meds at home and you resume or decide to hold them, it is a problem that needs to be her. Same for CAD, COPD, HLD and so on)  Left forearm and hand numbness and cramping -Rule out CVA, rule out focal seizure -Symptoms mild, considering not to be a candidate for tPA in the ED.  MRI pending, patient has been taking Plavix after first stroke, will add aspirin.  Continue statin, check lipid panel and A1c. -Allow permissive hypertension, as needed hydralazine for BP > 200/110 -EEG ordered by neurology.  Patient received 1 dose of 1000 mg of Keppra in the ED.  Further antiseizure medication as per neurology.  Seizure precautions. -UDS  HTN uncontrolled -Allow permissive hypertension, PRN hydralazine for 200/110  IIDM -Hold metformin, start sliding scale  HIV on HAART -diagnosed in year 2000, patient reported  that she has been compliant with her HAART medications, on Descovy (TAF/FTC) and DRV/RTV, most recent blood work CD4 stable and viral copy not detectable (05/2020) following with HIV specialist [facility]  Cigarette smoker -Educated to quit, start nicotine patch   DVT prophylaxis: Lovenox code Status: Full Code Family Communication: Significant other at bedside and daughter over the phone Disposition Plan: Expect less than 2 midnight hospital stay Consults called: Dr. Pearlean Brownie Neuro Admission status: Tele obs   Emeline General MD [facility] Pager [phone]  12/23/2020, 3:17 PM

## 2020-12-23 NOTE — Progress Notes (Signed)
EEG complete - results pending 

## 2020-12-23 NOTE — Progress Notes (Signed)
  Echocardiogram 2D Echocardiogram has been performed.  Melissa Vaughan F 12/23/2020, 5:01 PM

## 2020-12-23 NOTE — Code Documentation (Signed)
Stroke Response Nurse Documentation Code Documentation  Melissa Vaughan is a 50 y.o. female arriving to [facility] ED via [facility] on 12/23/2020 with past medical hx of stroke. Code stroke was activated by Urgent Care. Patient from Urgent Care where she reported having a new onset of headache and left hand pain that started at 1100. Pt came to the ED for her headache. It was crowded and so she decided to go to Urgent Care. While in UC, she noted left hand pain and they activated a Code Stroke.    Stroke team at the bedside on patient arrival. Labs drawn and patient cleared for CT by Dr. Anitra Lauth. Patient to CT with team. NIHSS 2, see documentation for details and code stroke times. Patient with right facial droop and dysarthria  per her baseline from previous stroke on exam. The following imaging was completed: CT. Patient is not a candidate for tPA due to being too mild to treat.   1 mg of Ativan given for hand cramping and pain.   Care/Plan: Patient remains in tPA window until 1530 - q30 NIHSS/VS until 1530, then q2 mNIHSS/VS. EEG ordered and MRI.   Bedside handoff with ED RN Alana.    Lucila Maine  Stroke Response RN

## 2020-12-23 NOTE — ED Notes (Signed)
Charge RN, Quarry manager called CODE STROKE per Wallis Bamberg, PA.

## 2020-12-23 NOTE — ED Triage Notes (Signed)
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 Pt states her BP was 210/129 at 1125 and 216/115 at 1200.

## 2020-12-23 NOTE — ED Provider Notes (Signed)
[facility]   CSN: [id number] Arrival date & time: 12/23/20  1356     History No chief complaint on file.   Melissa PonderMichelle Vaughan is a 50 y.o. female.  Patient is a 50 year old female with a history of diabetes, HIV, hypertension and CVA 2 years ago who is presenting today with a complaint that around 11:00 after she left the dentist she developed a severe headache which was 10 out of 10 and felt like her left hand was drawing up and spasming.  She initially went to urgent care with the symptoms and was found to be hypertensive and have some mild slurred speech and left-sided facial droop.  It is difficult based on the patient's report what baseline is for her from her prior stroke.  She has taken all of her blood pressure medication this morning and denies recently missing any doses of medication or change in medication.  She denies any chest pain, shortness of breath, abdominal pain.  She denies any symptoms in her leg.  No syncope.  Patient does smoke cigarettes daily but denies any alcohol or drug use.  Patient does not take any anticoagulation.  The history is provided by the patient, medical records and the EMS personnel.       Past Medical History:  Diagnosis Date  . HIV (human immunodeficiency virus infection) (HCC)   . Hypertension   . Type 2 diabetes mellitus without complication, without long-term current use of insulin (HCC) 10/31/2018    Patient Active Problem List   Diagnosis Date Noted  . Type 2 diabetes mellitus without complication, without long-term current use of insulin (HCC) 10/31/2018  . CVA (cerebral vascular accident) (HCC) 10/14/2018  . Chronic abdominal pain 07/23/2016  . Mood disorder (HCC) 04/09/2016  . Human immunodeficiency virus disease (HCC) 04/29/2011  . Essential hypertension 04/29/2011    No past surgical history on file.   OB History   No obstetric history on file.     No family history on  file.  Social History   Tobacco Use  . Smoking status: Current Some Day Smoker    Types: Cigarettes  . Smokeless tobacco: Never Used  Substance Use Topics  . Alcohol use: No  . Drug use: No    Home Medications Prior to Admission medications   Medication Sig Start Date End Date Taking? Authorizing Provider  atorvastatin (LIPITOR) 20 MG tablet Take 20 mg by mouth daily. 04/12/17   [provider]  benzonatate (TESSALON) 100 MG capsule Take 1 capsule (100 mg total) by mouth every 8 (eight) hours. Patient not taking: No sig reported 09/29/16   Long, Arlyss RepressJoshua G, MD  clopidogrel (PLAVIX) 75 MG tablet Take 75 mg by mouth daily.    [provider]  darunavir-cobicistat (PREZCOBIX) 800-150 MG tablet Take 1 tablet by mouth daily. 12/01/17   [provider]  dicyclomine (BENTYL) 20 MG tablet Take 1 tablet (20 mg total) by mouth 3 (three) times daily as needed for spasms. 10/31/20   Long, Arlyss RepressJoshua G, MD  dolutegravir (TIVICAY) 50 MG tablet Take 50 mg by mouth daily. 03/29/20   [provider]  emtricitabine-tenofovir AF (DESCOVY) 200-25 MG tablet Take 1 tablet by mouth daily. 12/01/17   [provider]  HYDROcodone-acetaminophen (NORCO/VICODIN) 5-325 MG tablet Take 1-2 tablets by mouth every 6 (six) hours as needed. Patient not taking: No sig reported 02/26/18   Benjiman CorePickering, Nathan, MD  lisinopril-hydrochlorothiazide (PRINZIDE,ZESTORETIC) 20-12.5 MG tablet Take 1 tablet by mouth  daily.    [provider]  metFORMIN (GLUCOPHAGE) 500 MG tablet Take 500 tablets by mouth 2 (two) times daily. 02/08/18   [provider]  ondansetron (ZOFRAN-ODT) 4 MG disintegrating tablet Take 1 tablet (4 mg total) by mouth every 8 (eight) hours as needed for nausea or vomiting. Patient not taking: No sig reported 02/26/18   Benjiman Core, MD    Allergies    Patient has no known allergies.  Review of Systems   Review of Systems  All other systems reviewed and are  negative.   Physical Exam Updated Vital Signs BP (!) 165/113   Pulse 68   Temp 98.6 F (37 C) (Oral)   Resp 16   Wt 69.6 kg   LMP  (LMP Unknown)   SpO2 100%   BMI 27.18 kg/m   Physical Exam Vitals and nursing note reviewed.  Constitutional:      General: She is not in acute distress.    Appearance: Normal appearance. She is well-developed.  HENT:     Head: Normocephalic and atraumatic.  Eyes:     Pupils: Pupils are equal, round, and reactive to light.  Cardiovascular:     Rate and Rhythm: Normal rate and regular rhythm.     Pulses: Normal pulses.     Heart sounds: Normal heart sounds. No murmur heard. No friction rub.  Pulmonary:     Effort: Pulmonary effort is normal.     Breath sounds: Normal breath sounds. No wheezing or rales.  Abdominal:     General: Bowel sounds are normal. There is no distension.     Palpations: Abdomen is soft.     Tenderness: There is no abdominal tenderness. There is no guarding or rebound.  Musculoskeletal:        General: No tenderness. Normal range of motion.     Comments: No edema  Skin:    General: Skin is warm and dry.     Findings: No rash.  Neurological:     Mental Status: She is alert and oriented to person, place, and time.     Cranial Nerves: Cranial nerve deficit present.     Motor: No pronator drift.     Coordination: Coordination is intact.     Comments: Left-sided facial droop.  Left hand with spasm and held in flexion.  Left hand weakness 4/5.  Psychiatric:        Behavior: Behavior normal.     ED Results / Procedures / Treatments   Labs (all labs ordered are listed, but only abnormal results are displayed) Labs Reviewed  I-STAT CHEM 8, ED - Abnormal; Notable for the following components:      Result Value   Glucose, Bld 138 (*)    Calcium, Ion 1.12 (*)    All other components within normal limits  CBG MONITORING, ED - Abnormal; Notable for the following components:   Glucose-Capillary 132 (*)    All other  components within normal limits  PROTIME-INR  APTT  CBC  DIFFERENTIAL  COMPREHENSIVE METABOLIC PANEL  I-STAT BETA HCG BLOOD, ED ([facility], [facility], [facility] ONLY)    EKG None  Radiology CT HEAD CODE STROKE WO CONTRAST  Result Date: 12/23/2020 CLINICAL DATA:  Code stroke.  Left hand numbness EXAM: CT HEAD WITHOUT CONTRAST TECHNIQUE: Contiguous axial images were obtained from the base of the skull through the vertex without intravenous contrast. COMPARISON:  10/31/2020 FINDINGS: Brain: No acute intracranial hemorrhage or mass effect. Possible new small area of loss of gray-white  differentiation in the right perirolandic region near the sensorimotor hand region. Chronic infarcts of the right greater than left basal ganglia and adjacent white matter. Ventricles and sulci are within normal limits in size and configuration. No extra-axial collection. Vascular: No hyperdense vessel. Skull: Unremarkable. Sinuses/Orbits: No acute abnormality. Other: Mastoid air cells are clear. ASPECTS [facility] Stroke Program Early CT Score) - Ganglionic level infarction (caudate, lentiform nuclei, internal capsule, insula, M1-M3 cortex): 7 - Supraganglionic infarction (M4-M6 cortex): 2 Total score (0-10 with 10 being normal): 9 IMPRESSION: Question of a small acute right perirolandic infarct near the sensorimotor hand region. This is not confirmed in all planes and could reflect volume averaging with the sulcus. No acute intracranial hemorrhage. Stable chronic findings detailed above. These results were communicated to Dr. Pearlean Brownie at 2:15 pm on 12/23/2020 by text page via the [facility] messaging system. Electronically Signed   By: Guadlupe Spanish M.D.   On: 12/23/2020 14:19    Procedures Procedures   Medications Ordered in ED Medications  sodium chloride flush (NS) 0.9 % injection 3 mL (has no administration in time range)    ED Course  I have reviewed the triage vital signs and the nursing notes.  Pertinent labs & imaging results that  were available during my care of the patient were reviewed by me and considered in my medical decision making (see chart for details).    MDM Rules/Calculators/A&P                          Patient presenting from urgent care today due to concern for stroke versus hypertensive emergency.  Patient developed sudden onset of headache, left-sided hand discomfort and spasm that she reports occurred around 11:00 today.  She had been to the dentist earlier today and was her normal self.  She then went to a graduation but reports she was feeling worse and went to urgent care.  Patient was hypertensive at urgent care with blood pressures of 210/130 despite taking her medications this morning.  She denies any chest pain or shortness of breath.  Does have notable facial droop on exam.  Stroke team present immediately upon patient arrival.  She went to the scanner as her airways and was intact.  2:23 PM Thus far CBC, i-STAT Chem-8 without acute findings.  CT shows question of a small acute right periroLandic infarct near the sensorimotor hand region however also could be volume averaging with the sulcus.  Patient's NIH is currently 0.  Patient given labetalol for blood pressure control.  MRI for further evaluation.  Patient symptoms could also be related to a focal seizure.  2:44 PM Dr. Pearlean Brownie with neuro recommends MRI, EEG and admission for further monitoring, testing and BP control.  BP no 165/113 after labetalol.  MDM Number of Diagnoses or Management Options   Amount and/or Complexity of Data Reviewed Clinical lab tests: ordered and reviewed Tests in the radiology section of CPT: ordered and reviewed Tests in the medicine section of CPT: ordered and reviewed Decide to obtain previous medical records or to obtain history from someone other than the patient: yes Obtain history from someone other than the patient: yes Review and summarize past medical records: yes Discuss the patient with other  providers: yes Independent visualization of images, tracings, or specimens: yes  Patient Progress Patient progress: improved     Final Clinical Impression(s) / ED Diagnoses Final diagnoses:  Hypertensive urgency  Focal seizure (HCC)    Rx /  DC Orders ED Discharge Orders    None       Gwyneth Sprout, MD 12/23/20 1445

## 2020-12-23 NOTE — Consult Note (Addendum)
Neurology consult   CC: code stroke  History is obtained from: EMS, chart   HPI:  Melissa Vaughan is a 50 yo female with a PMHx of CVA, HTN, HLD, HIV and DM II.  A lot of history of today's events taken from chart, as patient is a poor historian of what time her symptoms started. Patient states she was at a graduation this a.m. She went to the dentist to have tooth extraction, but after taking BP (200/110s), dentist cancelled procedure. She then went to UC because of her BP and at that time, she began to have a frontal and left sided HA. After evaluation at [facility], a code stroke was called by the PA there, and patient was brought by EMS to our ED. Per EMS, her BP remained over 190s en route and she continued to complain of HA. Her left hand cramping startted at [facility].   Patient relates bilateral hand weakness sequelae since last stroke. Also, right facial droop is not new. Her biggest complaint is her left hand pain with cramping and she is unable to flex or extend her left hand or fingers.  Patient states her BP is usually high and does not know the name of her medications.             After brief exam on the ED bridge and for airway clearance, patient was taken emergently to CT suite. CTH showed no acute infarct. NIHSS 0.  tPA not given due to low NIHSS and asymptomatic except for left hand cramping. CTA head and neck was felt to not be needed due to low suspicion for LVO.   Patient was taken back to ED room. Given that some seizure types can cause hand cramping or pain, Dr. Pearlean Brownie ordered 1 mg Ativan and this greatly relieved her left hand pain and movement was close to normal.   In ED, Keppra 1 gm IV load was given.   In review of chart, patient was in ED on 10/31/20 with HA and HTN. NP looked back 4 years in our chart and care everywhere and do not see any notes re: stroke.   LKW: 1100 hours tpa given?: No.  IR Thrombectomy?: No, no LVO. MRS 0  NIHSS:  1a Level of Consciousness: 0 1b LOC Questions:  0 1c LOC Commands: 0 2 Best Gaze: 0 3 Visual: 0 4 Facial Palsy: 1 5a Motor Arm - left: 0 5b Motor Arm - Right: 0 6a Motor Leg - Left: 0 6b Motor Leg - Right: 0 7 Limb Ataxia: 0 8 Sensory: 0 9 Best Language: 0 10 Dysarthria: 0 11 Extinction and Inattention: 0 TOTAL:  1  ROS: A robust ROS was unable to be performed due to emergent nature of event.   Past Medical History:  Diagnosis Date  . HIV (human immunodeficiency virus infection) (HCC)   . Hypertension   . Type 2 diabetes mellitus without complication, without long-term current use of insulin (HCC) 10/31/2018   No family history on file.  Social History:  reports that she has been smoking cigarettes. She has never used smokeless tobacco. She reports that she does not drink alcohol and does not use drugs.   Prior to Admission medications   Medication Sig Start Date End Date Taking? Authorizing Provider  atorvastatin (LIPITOR) 20 MG tablet Take 20 mg by mouth daily. 04/12/17   [provider]  benzonatate (TESSALON) 100 MG capsule Take 1 capsule (100 mg total) by mouth every 8 (eight) hours. Patient not  taking: No sig reported 09/29/16   Long, Arlyss Repress, MD  clopidogrel (PLAVIX) 75 MG tablet Take 75 mg by mouth daily.    [provider]  darunavir-cobicistat (PREZCOBIX) 800-150 MG tablet Take 1 tablet by mouth daily. 12/01/17   [provider]  dicyclomine (BENTYL) 20 MG tablet Take 1 tablet (20 mg total) by mouth 3 (three) times daily as needed for spasms. 10/31/20   Long, Arlyss Repress, MD  dolutegravir (TIVICAY) 50 MG tablet Take 50 mg by mouth daily. 03/29/20   [provider]  emtricitabine-tenofovir AF (DESCOVY) 200-25 MG tablet Take 1 tablet by mouth daily. 12/01/17   [provider]  HYDROcodone-acetaminophen (NORCO/VICODIN) 5-325 MG tablet Take 1-2 tablets by mouth every 6 (six) hours as needed. Patient not taking: No sig reported 02/26/18   Benjiman Core, MD   lisinopril-hydrochlorothiazide (PRINZIDE,ZESTORETIC) 20-12.5 MG tablet Take 1 tablet by mouth daily.    [provider]  metFORMIN (GLUCOPHAGE) 500 MG tablet Take 500 tablets by mouth 2 (two) times daily. 02/08/18   [provider]  ondansetron (ZOFRAN-ODT) 4 MG disintegrating tablet Take 1 tablet (4 mg total) by mouth every 8 (eight) hours as needed for nausea or vomiting. Patient not taking: No sig reported 02/26/18   Benjiman Core, MD   Exam: Current vital signs: BP (!) 165/113   Pulse 68   Temp 98.6 F (37 C) (Oral)   Resp 16   Ht 5\' 3"  (1.6 m)   Wt 69.6 kg   LMP  (LMP Unknown)   SpO2 100%   BMI 27.18 kg/m   Physical Exam  Constitutional: Middle-aged African-American lady appears well-developed and well-nourished. Complaining of severe left hand pain.  Psych: Affect appropriate to situation. Eyes: No scleral injection. HENT: No OP obstruction. Head: Normocephalic.  Cardiovascular: Normal rate and regular rhythm.  Respiratory: Effort normal.  GI: Abdomen soft.  No distension. There is no tenderness.  Skin: WDI  Neuro: Mental Status: Patient is awake, alert, oriented to person, place, month, year, and situation. Patient is able to give a somewhat clear and coherent history. No signs of neglect. Speech/Language:  Speech is clear, fluent without dysarthria or aphasia. Repetition, naming, and comprehension intact.  Cranial Nerves: II: Visual Fields are full. Pupils are equal, round, and reactive to light.  III,IV, VI: EOMI without ptosis or diploplia.  V: Facial sensation is symmetric to light touch in V1, V2, and V3 VII: slight right facial droop (old).  VIII: hearing is intact to voice. X: Uvula elevates symmetrically. XI: Shoulder shrug is symmetric. XII: tongue is midline without atrophy or fasciculations.  Motor: RUE:  grips  5/5     biceps  5/5     triceps 5 /5 LUE:  grips  3+/5 (after relief of pain 4+/5)    biceps  5/5     triceps   5/5 RLE: knee  5/5     thigh   5/5     plantar flexion  5/5     dorsiflexion 5/5 LLE: knee  5/5     thigh   5/5      plantar flexion   5/5     dorsiflexion  5 /5 Tone is normal. Bulk is normal.  Left hand has dystonic posturing with extension of the MP joints in flexion of the interphalangeal joints patient grimaces in pain even with passive movements Sensory: Sensation is symmetric to light touch in all fours extremities. Extinction absent to light touch DSS.  Plantars: Toes are downgoing  bilaterally.  Cerebellar: No ataxia noted with FNF and HKS bilaterally.   I have reviewed labs in epic and the pertinent results are: INR 1.0       aPTT 26      Creatinine 0.9  Calcium 8.9   K 3.6  MD reviewed the images obtained:  NCT head  Question of a small acute right perirolandic infarct near the sensorimotor hand region. This is not confirmed in all planes and could reflect volume averaging with the sulcus. No acute intracranial hemorrhage.  Assessment: 50 yo female who presented from [facility] UC for HA, HTN (up in 200s) and left hand pain/cramping. Patient's stroke risk factors include prior stroke, HLD, DM II, and uncontrolled HTN. Though, there was a possible acute infarct in the right perironlandic region near the sesorimotor hand region, it was felt this was old infarcts. was not felt to be true. Due to her symptoms resolving after 1mg  Ativan, seizure is on the differential. Given tetany can present with cramping symptoms, will explore metabolic causes of symptoms.   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  Doubt stroke Patient has presented within time window for tPA but clinical presentation and exam findings are not consistent with stroke.  Patient was given 1 mg of IV Ativan which resulted in almost immediate decrease in hand pain and dystonic posturing and patient was able to bend her fingers. Plan: - Medicine admit to medical team. -1 gm Keppra IV x 1-done. Will evaluate  for further AEDs after EEG.  - MRI brain without contrast-negative for acute stroke. -Check lab work for tetany like calcium and magnesium levels - Recommend Statin or increased dose if LDL > 70 - Aspirin 81mg  daily. -Continue Plavix 75mg  po qd.  -   normalize blood pressure per Hypertensive urgency protocol. -  - bedside Swallow screen. - Stroke education. - PT/OT/SLP consult. - frequent neuro checks per seizure protocol.  - rEEG to eval for any epileptogenic discharges. - Recommend metabolic/infectious workup with UA with UCx, CXR, CK, serum lactate.   Electronically signed by: Patient seen by , MSN, APN-BC, nurse practitioner and by Dr. , MD. Note/plan to be edited by MD as needed.  Pager: [phone]  I have personally obtained history,examined this patient, reviewed notes, independently viewed imaging studies, participated in medical decision making and plan of care.ROS completed by me personally and pertinent positives fully documented  I have made any additions or clarifications directly to the above note. Agree with note above.  Patient is a poor historian and has presented mostly with a chief complaint of left hand pain and discomfort which is not consistent with a stroke.  She has history of previous bilateral strokes this could represent either focal dystonia or seizure.  She has shown some benefit after IV Ativan hence recommend IV Keppra for now and check EEG for seizures.  MRI seems negative for acute stroke.  Discussed with Dr. Jimmye Norman, ER, MD This patient is critically ill and at significant risk of neurological worsening, death and care requires constant monitoring of vital signs, hemodynamics,respiratory and cardiac monitoring, neurological assessment, discussion with family, other specialists and medical decision making of high complexity. I spent 45 minutes of neurocritical care time  in the care of  this patient. This was time spent independent  of any time provided by nurse practitioner or PA. Pearlean Brownie, MD Medical Director [facility] Pager: [phone] 12/23/2020 4:45 PM

## 2020-12-23 NOTE — ED Triage Notes (Signed)
Pt arrived via [facility] from UC for c/o left hand cramping, and HA. Pt has slurred speach at baseline right facial droop and left sided weakness from prior stroke. Pt is A&Ox4. Pt is hypertensive. Pt's LKW 1100 today. Pt states went to dentist today to get tooth pulled and they would not do it, because her bp was in the 200's systolic. Per EMS pt walked well to stretcher at UC.

## 2020-12-23 NOTE — ED Notes (Signed)
911 was called to have pt transported to ER.

## 2020-12-23 NOTE — ED Notes (Signed)
Patient is being discharged from the Urgent Care and sent to the Emergency Department via EMS . Per Wallis Bamberg PA, patient is in need of higher level of care due to hypertension, headache, hand numbness. Patient is aware and verbalizes understanding of plan of care.  Vitals:   12/23/20 1335 12/23/20 1339  BP: (!) 190/124   Pulse: 71   Resp: 18   Temp:  98 F (36.7 C)  SpO2: 100%

## 2020-12-23 NOTE — ED Provider Notes (Signed)
[facility]   MRN: [id number] DOB: [birth year]  Subjective:   Melissa Vaughan is a 50 y.o. female presenting for acute onset of severe frontal to left-sided headache rated 10 out of 10 that started at 36 AM.  Patient states that she went to her scheduled dentist appointment for a dental extraction at noon and had systolic readings of 210 and 216 with diastolic readings of 129/115.  Patient denies any dental pain currently.  She has not taken anything for her headache.  She does have a history of a CVA 2 years ago and was hospitalized for this.  Denies any active chest pain but she does have left forearm and hand pain.  Also feels intermittent weakness of bilateral hands which she says is not new but started after she had her stroke 2 years ago. She does have left hand pain and cramping today which is new and makes her feel like her stroke symptoms are returning. She is a smoker.  No alcohol use.  Also has HIV and type 2 diabetes without complications. She take lis-HCTZ, Plavix for her pressure.   No current facility-administered medications for this encounter.  Current Outpatient Medications:  .  atorvastatin (LIPITOR) 20 MG tablet, Take 20 mg by mouth daily., Disp: , Rfl:  .  benzonatate (TESSALON) 100 MG capsule, Take 1 capsule (100 mg total) by mouth every 8 (eight) hours. (Patient not taking: No sig reported), Disp: 21 capsule, Rfl: 0 .  clopidogrel (PLAVIX) 75 MG tablet, Take 75 mg by mouth daily., Disp: , Rfl:  .  darunavir-cobicistat (PREZCOBIX) 800-150 MG tablet, Take 1 tablet by mouth daily., Disp: , Rfl:  .  dicyclomine (BENTYL) 20 MG tablet, Take 1 tablet (20 mg total) by mouth 3 (three) times daily as needed for spasms., Disp: 20 tablet, Rfl: 0 .  dolutegravir (TIVICAY) 50 MG tablet, Take 50 mg by mouth daily., Disp: , Rfl:  .  emtricitabine-tenofovir AF (DESCOVY) 200-25 MG tablet, Take 1 tablet by mouth daily., Disp: , Rfl:  .  HYDROcodone-acetaminophen  (NORCO/VICODIN) 5-325 MG tablet, Take 1-2 tablets by mouth every 6 (six) hours as needed. (Patient not taking: No sig reported), Disp: 8 tablet, Rfl: 0 .  lisinopril-hydrochlorothiazide (PRINZIDE,ZESTORETIC) 20-12.5 MG tablet, Take 1 tablet by mouth daily., Disp: , Rfl:  .  metFORMIN (GLUCOPHAGE) 500 MG tablet, Take 500 tablets by mouth 2 (two) times daily., Disp: , Rfl:  .  ondansetron (ZOFRAN-ODT) 4 MG disintegrating tablet, Take 1 tablet (4 mg total) by mouth every 8 (eight) hours as needed for nausea or vomiting. (Patient not taking: No sig reported), Disp: 8 tablet, Rfl: 0   No Known Allergies  Past Medical History:  Diagnosis Date  . HIV (human immunodeficiency virus infection) (HCC)   . Hypertension   . Type 2 diabetes mellitus without complication, without long-term current use of insulin (HCC) 10/31/2018     History reviewed. No pertinent surgical history.  History reviewed. No pertinent family history.  Social History   Tobacco Use  . Smoking status: Current Some Day Smoker    Types: Cigarettes  . Smokeless tobacco: Never Used  Substance Use Topics  . Alcohol use: No  . Drug use: No    ROS   Objective:   Vitals: BP (!) 190/124 (BP Location: Right Arm)   Pulse 71   Resp 18   LMP  (LMP Unknown)   SpO2 100%   Physical Exam Constitutional:      General: She is  not in acute distress.    Appearance: Normal appearance. She is well-developed. She is not ill-appearing, toxic-appearing or diaphoretic.  HENT:     Head: Normocephalic and atraumatic.     Nose: Nose normal.     Mouth/Throat:     Mouth: Mucous membranes are moist.  Eyes:     Extraocular Movements: Extraocular movements intact.     Pupils: Pupils are equal, round, and reactive to light.  Cardiovascular:     Rate and Rhythm: Normal rate and regular rhythm.     Pulses: Normal pulses.     Heart sounds: Normal heart sounds. No murmur heard. No friction rub. No gallop.   Pulmonary:     Effort: Pulmonary  effort is normal. No respiratory distress.     Breath sounds: Normal breath sounds. No stridor. No wheezing, rhonchi or rales.  Skin:    General: Skin is warm and dry.     Findings: No rash.  Neurological:     Mental Status: She is alert and oriented to person, place, and time.     Cranial Nerves: Facial asymmetry (left sided facial droop (not new)) present.     Comments: Negative pronator drift.   Psychiatric:        Mood and Affect: Mood normal.        Behavior: Behavior normal.        Thought Content: Thought content normal.     ED ECG REPORT   Date: 12/23/2020  Rate: 76bpm  Rhythm: normal sinus rhythm  QRS Axis: left  Intervals: normal  ST/T Wave abnormalities: nonspecific T wave changes  Conduction Disutrbances:left anterior fascicular block  Narrative Interpretation: Sinus rhythm at 76 bpm with left axis deviation, nonspecific T wave inversion in aVL, left anterior fascicular block.  EKG is comparable to the previous 1.  Old EKG Reviewed: unchanged  I have personally reviewed the EKG tracing and agree with the computerized printout as noted.   Assessment and Plan :   PDMP not reviewed this encounter.  1. Hypertensive emergency   2. Severe headache     Patient is in need of higher level of care than we can provide in urgent care setting.  Primary concern is for recurrent stroke.  Unable to establish IV line prior to EMS arrival.  Case report given out.  Transfer of care completed with expectation to have patient transferred to the [facility] emergency room.   Wallis Bamberg, [place] 12/23/20 1358

## 2020-12-23 NOTE — ED Notes (Signed)
Attempted to give reportx1 

## 2020-12-23 NOTE — ED Notes (Signed)
Patient transported to MRI 

## 2020-12-24 ENCOUNTER — Encounter (HOSPITAL_COMMUNITY): Payer: Medicaid Other

## 2020-12-24 DIAGNOSIS — R200 Anesthesia of skin: Secondary | ICD-10-CM

## 2020-12-24 DIAGNOSIS — R203 Hyperesthesia: Secondary | ICD-10-CM | POA: Diagnosis present

## 2020-12-24 DIAGNOSIS — R569 Unspecified convulsions: Secondary | ICD-10-CM | POA: Diagnosis present

## 2020-12-24 DIAGNOSIS — Z7984 Long term (current) use of oral hypoglycemic drugs: Secondary | ICD-10-CM | POA: Diagnosis not present

## 2020-12-24 DIAGNOSIS — I69392 Facial weakness following cerebral infarction: Secondary | ICD-10-CM | POA: Diagnosis not present

## 2020-12-24 DIAGNOSIS — Z20822 Contact with and (suspected) exposure to covid-19: Secondary | ICD-10-CM | POA: Diagnosis present

## 2020-12-24 DIAGNOSIS — M79602 Pain in left arm: Secondary | ICD-10-CM

## 2020-12-24 DIAGNOSIS — Z716 Tobacco abuse counseling: Secondary | ICD-10-CM | POA: Diagnosis not present

## 2020-12-24 DIAGNOSIS — R252 Cramp and spasm: Secondary | ICD-10-CM | POA: Diagnosis present

## 2020-12-24 DIAGNOSIS — E119 Type 2 diabetes mellitus without complications: Secondary | ICD-10-CM | POA: Diagnosis present

## 2020-12-24 DIAGNOSIS — I639 Cerebral infarction, unspecified: Secondary | ICD-10-CM | POA: Diagnosis present

## 2020-12-24 DIAGNOSIS — I6300 Cerebral infarction due to thrombosis of unspecified precerebral artery: Secondary | ICD-10-CM

## 2020-12-24 DIAGNOSIS — F17210 Nicotine dependence, cigarettes, uncomplicated: Secondary | ICD-10-CM | POA: Diagnosis present

## 2020-12-24 DIAGNOSIS — B20 Human immunodeficiency virus [HIV] disease: Secondary | ICD-10-CM | POA: Diagnosis not present

## 2020-12-24 DIAGNOSIS — Z7902 Long term (current) use of antithrombotics/antiplatelets: Secondary | ICD-10-CM | POA: Diagnosis not present

## 2020-12-24 DIAGNOSIS — I10 Essential (primary) hypertension: Secondary | ICD-10-CM | POA: Diagnosis present

## 2020-12-24 DIAGNOSIS — E785 Hyperlipidemia, unspecified: Secondary | ICD-10-CM | POA: Diagnosis present

## 2020-12-24 DIAGNOSIS — Z21 Asymptomatic human immunodeficiency virus [HIV] infection status: Secondary | ICD-10-CM | POA: Diagnosis present

## 2020-12-24 DIAGNOSIS — I160 Hypertensive urgency: Secondary | ICD-10-CM | POA: Diagnosis present

## 2020-12-24 DIAGNOSIS — Z79899 Other long term (current) drug therapy: Secondary | ICD-10-CM | POA: Diagnosis not present

## 2020-12-24 LAB — LIPID PANEL
Cholesterol: 139 mg/dL (ref 0–200)
HDL: 42 mg/dL (ref >40)
LDL Cholesterol: 78 mg/dL (ref 0–99)
Total CHOL/HDL Ratio: 3.3 RATIO
Triglycerides: 96 mg/dL (ref <150)
VLDL: 19 mg/dL (ref 0–40)

## 2020-12-24 LAB — URINALYSIS, ROUTINE W REFLEX MICROSCOPIC
Bilirubin Urine: NEGATIVE
Glucose, UA: NEGATIVE mg/dL
Ketones, ur: NEGATIVE mg/dL
Nitrite: NEGATIVE
Protein, ur: NEGATIVE mg/dL
Specific Gravity, Urine: 1.006 (ref 1.005–1.030)
pH: 6 (ref 5.0–8.0)

## 2020-12-24 LAB — GLUCOSE, CAPILLARY
Glucose-Capillary: 140 mg/dL — ABNORMAL HIGH (ref 70–99)
Glucose-Capillary: 148 mg/dL — ABNORMAL HIGH (ref 70–99)
Glucose-Capillary: 177 mg/dL — ABNORMAL HIGH (ref 70–99)
Glucose-Capillary: 170 mg/dL — ABNORMAL HIGH (ref 70–99)

## 2020-12-24 LAB — MAGNESIUM: Magnesium: 2.1 mg/dL (ref 1.7–2.4)

## 2020-12-24 LAB — TSH: TSH: 1.572 u[IU]/mL (ref 0.350–4.500)

## 2020-12-24 MED ORDER — ONDANSETRON HCL 4 MG/2ML IJ SOLN
4.0000 mg | Freq: Four times a day (QID) | INTRAMUSCULAR | Status: DC | PRN
  Administered 2020-12-24: 4 mg via INTRAVENOUS
  Filled 2020-12-24 (×2): qty 2

## 2020-12-24 MED ORDER — LABETALOL HCL 5 MG/ML IV SOLN: 10.0000 mg | Freq: Four times a day (QID) | INTRAVENOUS | Status: DC | PRN

## 2020-12-24 NOTE — Progress Notes (Signed)
PROGRESS NOTE  Melissa Vaughan WGN:[id number] DOB: 01/13/1971 DOA: 12/23/2020 PCP: Pcp, No   LOS: 0 days   Brief narrative:  Melissa Vaughan is a 50 y.o. female with medical history significant of HIV on HAART, HTN, history of stroke, HLD, IIDM, presented to the hospital with new onset of left forearm and hand cramping with headache nausea.  Blood pressure was very elevated.  Patient was then brought into the hospital CT head scan showed suspected acute right perirolandic infarct near the sensorimotor and region.  Neurology was consulted.  There was some concern for focal seizure so 1 dose of Keppra was given.  Patient was then admitted to hospital for further evaluation and treatment.  Assessment/Plan:  Active Problems:   CVA (cerebral vascular accident) (HCC)  Left forearm and hand numbness and cramping MRI of the brain showed no acute infarct.  Remote lacunar infarcts in the right corona radiata and left caudate head.  Focal hand dystonia with on the differential.  EEG of the brain was negative.  Neurology recommends Plavix and aspirin for secondary prevention of stroke.  Follow neurology recommendation.  Currently on aspirin Lipitor Plavix.  Hemoglobin A1c pending.  Cholesterol profile reviewed.  Further management as per neurology recommendations.Marland Kitchen  HTN uncontrolled On presentation.  On as needed hydralazine and labetalol.  Continue lisinopril hydrochlorothiazide from home.  Has been added on hydralazine p.o. as well..  Closely monitor.  Diabetes mellitus type 2.   Metformin on hold.  Continue sliding scale insulin Accu-Cheks diabetic diet. Closely monitor blood glucose levels.  HIV on HAART  patient's reports compliance with medications.  Follows up with [facility].  Cigarette smoker Continue nicotine patch.  DVT prophylaxis: enoxaparin (LOVENOX) injection 40 mg Start: 12/23/20 2200    Code Status: Full code  Family Communication: None  Status is:  Observation  The patient will require care spanning > 2 midnights and should be moved to inpatient because: Ongoing diagnostic testing needed not appropriate for outpatient work up, Unsafe d/c plan, IV treatments appropriate due to intensity of illness or inability to take PO and Inpatient level of care appropriate due to severity of illness  Dispo: The patient is from: Home              Anticipated d/c is to: Home              Patient currently is not medically stable to d/c.   Difficult to place patient No   Consultants:  Neurology  Procedures:  None  Anti-infectives:  Marland Kitchen Antiretroviral  Anti-infectives (From admission, onward)   Start     Dose/Rate Route Frequency Ordered Stop   12/23/20 1615  dolutegravir (TIVICAY) tablet 50 mg        50 mg Oral Daily 12/23/20 1515     12/23/20 1615  emtricitabine-tenofovir AF (DESCOVY) 200-25 MG per tablet 1 tablet        1 tablet Oral Daily 12/23/20 1515     12/23/20 1530  darunavir-cobicistat (PREZCOBIX) 800-150 MG per tablet 1 tablet  Status:  Discontinued        1 tablet Oral Daily 12/23/20 1515 12/23/20 1555     Subjective: Today, patient was seen and examined at bedside.  Patient complains of left-sided mild weakness, headache and hand pain.  Objective: Vitals:   12/24/20 1130 12/24/20 1157  BP: (!) 202/95 (!) 177/76  Pulse: 63 (!) 58  Resp: 16 18  Temp: 97.8 F (36.6 C) 98.2 F (36.8 C)  SpO2: 100% 100%  Intake/Output Summary (Last 24 hours) at 12/24/2020 1449 Last data filed at 12/24/2020 1135 Gross per 24 hour  Intake 1175.73 ml  Output 2150 ml  Net -974.27 ml   Filed Weights   12/23/20 1400  Weight: 69.6 kg   Body mass index is 27.18 kg/m.   Physical Exam: GENERAL: Patient is alert awake and mildly communicative.. Not in obvious distress. HENT: No scleral pallor or icterus. Pupils equally reactive to light. Oral mucosa is moist NECK: is supple, no gross swelling noted. CHEST: Clear to auscultation. No  crackles or wheezes.  Diminished breath sounds bilaterally. CVS: S1 and S2 heard, no murmur. Regular rate and rhythm.  ABDOMEN: Soft, non-tender, bowel sounds are present. EXTREMITIES: No edema. CNS: Mild right-sided facial droop which is old.  Left upper extremity grip weakness.  Increased sensitivity of the left upper extremity. SKIN: warm and dry without rashes.  Data Review: I have personally reviewed the following laboratory data and studies,  CBC: Recent Labs  Lab 12/23/20 1402 12/23/20 1408  WBC 6.7  --   NEUTROABS 3.7  --   HGB 13.6 13.9  HCT 42.2 41.0  MCV 88.8  --   PLT 305  --    Basic Metabolic Panel: Recent Labs  Lab 12/23/20 1402 12/23/20 1408 12/24/20 1248  NA 139 143  --   K 3.6 3.6  --   CL 109 109  --   CO2 21*  --   --   GLUCOSE 141* 138*  --   BUN 13 14  --   CREATININE 0.99 0.90  --   CALCIUM 8.9  --   --   MG  --   --  2.1   Liver Function Tests: Recent Labs  Lab 12/23/20 1402  AST 12*  ALT 10  ALKPHOS 110  BILITOT 0.5  PROT 7.7  ALBUMIN 4.1   No results for input(s): LIPASE, AMYLASE in the last 168 hours. No results for input(s): AMMONIA in the last 168 hours. Cardiac Enzymes: No results for input(s): CKTOTAL, CKMB, CKMBINDEX, TROPONINI in the last 168 hours. BNP (last 3 results) No results for input(s): BNP in the last 8760 hours.  ProBNP (last 3 results) No results for input(s): PROBNP in the last 8760 hours.  CBG: Recent Labs  Lab 12/23/20 1402 12/23/20 1741 12/23/20 2152 12/24/20 0650 12/24/20 1157  GLUCAP 132* 135* 168* 140* 148*   Recent Results (from the past 240 hour(s))  SARS CORONAVIRUS 2 (TAT 6-24 HRS) Nasopharyngeal Nasopharyngeal Swab     Status: None   Collection Time: 12/23/20  4:00 PM   Specimen: Nasopharyngeal Swab  Result Value Ref Range Status   SARS Coronavirus 2 NEGATIVE NEGATIVE Final    Comment: (NOTE) SARS-CoV-2 target nucleic acids are NOT DETECTED.  The SARS-CoV-2 RNA is generally detectable  in upper and lower respiratory specimens during the acute phase of infection. Negative results do not preclude SARS-CoV-2 infection, do not rule out co-infections with other pathogens, and should not be used as the sole basis for treatment or other patient management decisions. Negative results must be combined with clinical observations, patient history, and epidemiological information. The expected result is Negative.  Fact Sheet for Patients: HairSlick.no  Fact Sheet for Healthcare Providers: quierodirigir.com  This test is not yet approved or cleared by the [place] FDA and  has been authorized for detection and/or diagnosis of SARS-CoV-2 by FDA under an Emergency Use Authorization (EUA). This EUA will remain  in effect (meaning this test can  be used) for the duration of the COVID-19 declaration under Se ction 564(b)(1) of the Act, 21 U.S.C. section 360bbb-3(b)(1), unless the authorization is terminated or revoked sooner.  Performed at [facility], [address] [place], [place] [address]      Studies: MR BRAIN WO CONTRAST  Result Date: 12/23/2020 CLINICAL DATA:  Neuro deficit, acute, stroke suspected. EXAM: MRI HEAD WITHOUT CONTRAST TECHNIQUE: Multiplanar, multiecho pulse sequences of the brain and surrounding structures were obtained without intravenous contrast. COMPARISON:  Head CT December 23, 2020 FINDINGS: Brain: No acute infarction, hemorrhage, hydrocephalus, extra-axial collection or mass lesion. Remote lacunar infarcts in the right corona radiata and left caudate head. Scattered foci of T2 hyperintensity are seen within the white matter of the cerebral hemispheres, nonspecific. Vascular: Normal flow voids. Skull and upper cervical spine: Normal marrow signal. Sinuses/Orbits: Bilateral lens surgery. Trace mucosal thickening throughout the paranasal sinuses. Other: None. IMPRESSION: 1. No acute intracranial  abnormality. 2. Remote lacunar infarcts in the right corona radiata and left caudate head. 3. Small amount of nonspecific T2 hyperintense lesions of the white matter. Electronically Signed   By: Baldemar LenisKatyucia  [place] Rodrigues M.D.   On: 12/23/2020 17:23   EEG adult  Result Date: 12/24/2020 Charlsie QuestYadav, Priyanka O, MD     12/24/2020  8:48 AM Patient Name: Dairl PonderMichelle Flanery MRN: [id number] Epilepsy Attending: Charlsie QuestPriyanka O Yadav Referring Physician/Provider: Jimmye NormanKaren Kirby-Graham, NP Date: 12/23/2020 Duration: 22.08 mins Patient history: 50 yo female who presented for HA, HTN (up in 200s) and left hand pain/cramping. EEG to evaluate for seizure Level of alertness: Awake AEDs during EEG study: None Technical aspects: This EEG study was done with scalp electrodes positioned according to the 10-20 International system of electrode placement. Electrical activity was acquired at a sampling rate of 500Hz  and reviewed with a high frequency filter of 70Hz  and a low frequency filter of 1Hz . EEG data were recorded continuously and digitally stored. Description: The posterior dominant rhythm consists of 9 Hz activity of moderate voltage (25-35 uV) seen predominantly in posterior head regions, symmetric and reactive to eye opening and eye closing. There is an excessive amount of 15 to 18 Hz beta activity distributed symmetrically and diffusely.  Hyperventilation and photic stimulation were not performed.   Of note, EEG was technically difficult due to significant myogenic artifact ABNORMALITY - Excessive beta, generalized IMPRESSION: This technically difficult  study is within normal limits. No seizures or epileptiform discharges were seen throughout the recording. The excessive beta activity seen in the background is most likely due to the effect of benzodiazepine and is a benign EEG pattern. Charlsie Questriyanka O Yadav   ECHOCARDIOGRAM COMPLETE  Result Date: 12/23/2020    ECHOCARDIOGRAM REPORT   Patient Name:   Dairl PonderMICHELLE Duce Date of Exam: 12/23/2020  Medical Rec #:  [id number]        Height:       63.0 in Accession #:    [id number]       Weight:       153.4 lb Date of Birth:  04/23/1971         BSA:          1.728 m Patient Age:    50 years         BP:           158/118 mmHg Patient Gender: F                HR:           65  bpm. Exam Location:  Inpatient Procedure: 2D Echo, Cardiac Doppler and Color Doppler Indications:    TIA G45.9  History:        Patient has no prior history of Echocardiogram examinations.  Sonographer:    Roosvelt Maser RDCS Referring Phys: [id number] Emeline General IMPRESSIONS  1. Left ventricular ejection fraction, by estimation, is 60 to 65%. The left ventricle has normal function. The left ventricle has no regional wall motion abnormalities. There is mild concentric left ventricular hypertrophy. Left ventricular diastolic parameters are consistent with Grade II diastolic dysfunction (pseudonormalization).  2. Right ventricular systolic function is normal. The right ventricular size is normal. Tricuspid regurgitation signal is inadequate for assessing PA pressure.  3. Left atrial size was moderately dilated.  4. Right atrial size was mildly dilated.  5. A small pericardial effusion is present. The pericardial effusion is circumferential.  6. The mitral valve is normal in structure. No evidence of mitral valve regurgitation. No evidence of mitral stenosis.  7. The aortic valve is tricuspid. Aortic valve regurgitation is not visualized. No aortic stenosis is present.  8. The inferior vena cava is normal in size with greater than 50% respiratory variability, suggesting right atrial pressure of 3 mmHg. Comparison(s): No prior Echocardiogram. Conclusion(s)/Recommendation(s): Otherwise normal echocardiogram, with minor abnormalities described in the report. FINDINGS  Left Ventricle: Left ventricular ejection fraction, by estimation, is 60 to 65%. The left ventricle has normal function. The left ventricle has no regional wall motion abnormalities. The  left ventricular internal cavity size was normal in size. There is  mild concentric left ventricular hypertrophy. Left ventricular diastolic parameters are consistent with Grade II diastolic dysfunction (pseudonormalization). Right Ventricle: The right ventricular size is normal. No increase in right ventricular wall thickness. Right ventricular systolic function is normal. Tricuspid regurgitation signal is inadequate for assessing PA pressure. Left Atrium: Left atrial size was moderately dilated. Right Atrium: Right atrial size was mildly dilated. Pericardium: A small pericardial effusion is present. The pericardial effusion is circumferential. Mitral Valve: The mitral valve is normal in structure. No evidence of mitral valve regurgitation. No evidence of mitral valve stenosis. Tricuspid Valve: The tricuspid valve is normal in structure. Tricuspid valve regurgitation is trivial. No evidence of tricuspid stenosis. Aortic Valve: The aortic valve is tricuspid. Aortic valve regurgitation is not visualized. No aortic stenosis is present. Aortic valve mean gradient measures 4.0 mmHg. Aortic valve peak gradient measures 7.0 mmHg. Aortic valve area, by VTI measures 1.99 cm. Pulmonic Valve: The pulmonic valve was grossly normal. Pulmonic valve regurgitation is trivial. No evidence of pulmonic stenosis. Aorta: The aortic root and ascending aorta are structurally normal, with no evidence of dilitation. Venous: The inferior vena cava is normal in size with greater than 50% respiratory variability, suggesting right atrial pressure of 3 mmHg. IAS/Shunts: The atrial septum is grossly normal.  LEFT VENTRICLE PLAX 2D LVIDd:         3.95 cm  Diastology LVIDs:         2.70 cm  LV e' medial:    5.77 cm/s LV PW:         1.10 cm  LV E/e' medial:  16.3 LV IVS:        1.10 cm  LV e' lateral:   9.90 cm/s LVOT diam:     1.90 cm  LV E/e' lateral: 9.5 LV SV:         60 LV SV Index:   35 LVOT Area:     2.84 cm  RIGHT  VENTRICLE RV Basal diam:   3.30 cm LEFT ATRIUM             Index       RIGHT ATRIUM           Index LA diam:        3.20 cm 1.85 cm/m  RA Area:     14.20 cm LA Vol (A2C):   81.2 ml 47.00 ml/m RA Volume:   36.30 ml  21.01 ml/m LA Vol (A4C):   52.2 ml 30.21 ml/m LA Biplane Vol: 68.1 ml 39.42 ml/m  AORTIC VALVE AV Area (Vmax):    2.19 cm AV Area (Vmean):   2.03 cm AV Area (VTI):     1.99 cm AV Vmax:           132.00 cm/s AV Vmean:          87.000 cm/s AV VTI:            0.303 m AV Peak Grad:      7.0 mmHg AV Mean Grad:      4.0 mmHg LVOT Vmax:         102.00 cm/s LVOT Vmean:        62.200 cm/s LVOT VTI:          0.213 m LVOT/AV VTI ratio: 0.70  AORTA Ao Root diam: 2.45 cm Ao Asc diam:  3.10 cm MITRAL VALVE MV Area (PHT): 4.49 cm    SHUNTS MV Decel Time: 169 msec    Systemic VTI:  0.21 m MV E velocity: 94.10 cm/s  Systemic Diam: 1.90 cm MV A velocity: 86.30 cm/s MV E/A ratio:  1.09 Jodelle Red MD Electronically signed by Jodelle Red MD Signature Date/Time: 12/23/2020/6:52:53 PM    Final    CT HEAD CODE STROKE WO CONTRAST  Result Date: 12/23/2020 CLINICAL DATA:  Code stroke.  Left hand numbness EXAM: CT HEAD WITHOUT CONTRAST TECHNIQUE: Contiguous axial images were obtained from the base of the skull through the vertex without intravenous contrast. COMPARISON:  10/31/2020 FINDINGS: Brain: No acute intracranial hemorrhage or mass effect. Possible new small area of loss of gray-white differentiation in the right perirolandic region near the sensorimotor hand region. Chronic infarcts of the right greater than left basal ganglia and adjacent white matter. Ventricles and sulci are within normal limits in size and configuration. No extra-axial collection. Vascular: No hyperdense vessel. Skull: Unremarkable. Sinuses/Orbits: No acute abnormality. Other: Mastoid air cells are clear. ASPECTS [facility] Stroke Program Early CT Score) - Ganglionic level infarction (caudate, lentiform nuclei, internal capsule, insula, M1-M3  cortex): 7 - Supraganglionic infarction (M4-M6 cortex): 2 Total score (0-10 with 10 being normal): 9 IMPRESSION: Question of a small acute right perirolandic infarct near the sensorimotor hand region. This is not confirmed in all planes and could reflect volume averaging with the sulcus. No acute intracranial hemorrhage. Stable chronic findings detailed above. These results were communicated to Dr. Pearlean Brownie at 2:15 pm on 12/23/2020 by text page via the [facility] messaging system. Electronically Signed   By: Guadlupe Spanish M.D.   On: 12/23/2020 14:19      Joycelyn Das, MD  [facility] 12/24/2020  If 7PM-7AM, please contact night-coverage

## 2020-12-24 NOTE — Progress Notes (Signed)
PT Cancellation Note  Patient Details Name: Melissa Vaughan MRN: [id number] DOB: 07/19/1971   Cancelled Treatment:    Reason Eval/Treat Not Completed: Medical issues which prohibited therapy. Pt in bed with c/o 10/10 headache. BP 219/106. Cuff readjusted and second BP taken for accuracy, 208/95. RN notified. PT to hold eval for now and re-attempt as time allows.   Ilda Foil 12/24/2020, 8:27 AM  Aida Raider, PT  Office # [phone] Pager [phone]

## 2020-12-24 NOTE — Progress Notes (Signed)
Confused and attempts to get out of bed to go to the bathroom on her own, although is reminded that for safetly reasons she needs to go with assist.  She also asks the same question several times in a short time period.  Has to be reminded and redirected often.  Is on her menses and will remove the pad and throw it onto the floor and has to be reminded not to do that.

## 2020-12-24 NOTE — Progress Notes (Signed)
Pt still c/o 10/10 left hand cramping. No change from before per Pt. 650mg  of Tylenol given per [facility]. MD paged.

## 2020-12-24 NOTE — Progress Notes (Signed)
OT Cancellation Note  Patient Details Name: Melissa Vaughan MRN: [id number] DOB: 06-14-1971   Cancelled Treatment:    Reason Eval/Treat Not Completed: Other (comment) Nsg requesting OT hold at this time duet o 10/10 HA and systolic BP over 053. Will return later time as schedule allows.   Thornell Mule, OT/L   Acute OT Clinical Specialist Acute Rehabilitation Services Pager [phone] Office [phone]  12/24/2020, 10:56 AM

## 2020-12-24 NOTE — Evaluation (Signed)
Speech Language Pathology Evaluation Patient Details Name: Melissa Vaughan MRN: [id number] DOB: [birth year] Today's Date: 12/24/2020 Time: 9798-9211 SLP Time Calculation (min) (ACUTE ONLY): 27 min  Problem List:  Patient Active Problem List   Diagnosis Date Noted  . Hypertensive urgency   . Focal seizure (HCC)   . Type 2 diabetes mellitus without complication, without long-term current use of insulin (HCC) 10/31/2018  . CVA (cerebral vascular accident) (HCC) 10/14/2018  . Chronic abdominal pain 07/23/2016  . Mood disorder (HCC) 04/09/2016  . Human immunodeficiency virus disease (HCC) 04/29/2011  . Essential hypertension 04/29/2011   Past Medical History:  Past Medical History:  Diagnosis Date  . HIV (human immunodeficiency virus infection) (HCC)   . Hypertension   . Type 2 diabetes mellitus without complication, without long-term current use of insulin (HCC) 10/31/2018   Past Surgical History: No past surgical history on file. HPI:  Pt is a 50 yo female who went to UC, found to be hypertensive with some mild slurred speech and left-sided facial droop. At that time she also began to have a frontal and left sided HA. After evaluation at [facility], a code stroke was called by the PA and pt was brought to [facility] ED. Per EMS, BP remained over 190s en route and she continued to complain of HA. Left hand cramping started at [facility]. MRI 12/23/20 -No acute intracranial abnormality. EEG 12/24/20-within normal limits. No seizures or epileptiform discharges were seen throughout the recording. tPA not given due to low NIHSS and asymptomatic except for left hand cramping. PMHx: CVA, HTN, HLD, HIV and DM II.   Assessment / Plan / Recommendation Clinical Impression  Pt presents with impaired cognitive communication and motor speech with some deficits in auditory comprehension also noted with complex commands and complex y/n questions. [facility] Mental Status Examination (SLUMS) administered with pt yeilding  the following score: 11/30. Cognitive function marked primarily by impaired orientation to time, decreased recall memory, and impaired problem solving and executive functions. At the conversation level, speech intelligibility was 60%, which improved with cues for increased vocal intensity and slow rate of speech. Unable to obtain baseline cognitive or speech-language function, however pt reports changes in speech and memory since hospitalization. SLP to f/u for treatment of dysarthria and cognitive communication.    SLP Assessment  SLP Recommendation/Assessment: Patient needs continued Speech Lanaguage Pathology Services SLP Visit Diagnosis: Cognitive communication deficit (R41.841);Dysarthria and anarthria (R47.1)    Follow Up Recommendations   (TBD)    Frequency and Duration min 2x/week  2 weeks      SLP Evaluation Cognition  Overall Cognitive Status: Impaired/Different from baseline Arousal/Alertness: Awake/alert Orientation Level: Oriented to person;Oriented to place;Oriented to situation;Disoriented to time Attention: Sustained Sustained Attention: Appears intact Memory: Impaired Memory Impairment: Retrieval deficit;Decreased recall of new information Immediate Memory Recall:  (4/5 SLUMS) Awareness: Impaired Awareness Impairment: Intellectual impairment Problem Solving: Impaired Problem Solving Impairment: Functional basic Executive Function: Self Monitoring;Self Correcting Self Monitoring: Impaired Self Monitoring Impairment: Functional basic Self Correcting: Impaired Self Correcting Impairment: Verbal basic;Functional basic       Comprehension  Auditory Comprehension Overall Auditory Comprehension: Impaired Yes/No Questions: Impaired Complex Questions: 25-49% accurate Commands: Impaired Two Step Basic Commands: 75-100% accurate Complex Commands: 0-24% accurate Conversation: Simple EffectiveTechniques: Repetition;Extra processing time Visual  Recognition/Discrimination Discrimination: Not tested Reading Comprehension Reading Status: Not tested    Expression Expression Primary Mode of Expression: Verbal Verbal Expression Overall Verbal Expression: Appears within functional limits for tasks assessed Written Expression Dominant Hand: Right Written  Expression: Not tested   Oral / Motor  Oral Motor/Sensory Function Overall Oral Motor/Sensory Function: Mild impairment Facial ROM: Within Functional Limits Facial Symmetry: Abnormal symmetry right Motor Speech Overall Motor Speech: Impaired Articulation: Impaired Level of Impairment: Sentence Intelligibility: Intelligibility reduced Sentence: 75-100% accurate Conversation: 50-74% accurate Effective Techniques: Slow rate;Increased vocal intensity   GO                   Melissa Echevaria, MA, CCC-SLP Acute Rehabilitation Services Office Number: [phone]  Melissa Vaughan 12/24/2020, 11:08 AM

## 2020-12-24 NOTE — Plan of Care (Signed)

## 2020-12-24 NOTE — Procedures (Signed)
Patient Name: Melissa Vaughan  MRN: [id number]  Epilepsy Attending: Charlsie Quest  Referring Physician/Provider: Jimmye Norman, NP Date: 12/23/2020 Duration: 22.08 mins  Patient history: 50 yo female who presented for HA, HTN (up in 200s) and left hand pain/cramping. EEG to evaluate for seizure  Level of alertness: Awake  AEDs during EEG study: None  Technical aspects: This EEG study was done with scalp electrodes positioned according to the 10-20 International system of electrode placement. Electrical activity was acquired at a sampling rate of 500Hz  and reviewed with a high frequency filter of 70Hz  and a low frequency filter of 1Hz . EEG data were recorded continuously and digitally stored.   Description: The posterior dominant rhythm consists of 9 Hz activity of moderate voltage (25-35 uV) seen predominantly in posterior head regions, symmetric and reactive to eye opening and eye closing. There is an excessive amount of 15 to 18 Hz beta activity distributed symmetrically and diffusely.  Hyperventilation and photic stimulation were not performed.     Of note, EEG was technically difficult due to significant myogenic artifact  ABNORMALITY - Excessive beta, generalized  IMPRESSION: This technically difficult  study is within normal limits. No seizures or epileptiform discharges were seen throughout the recording. The excessive beta activity seen in the background is most likely due to the effect of benzodiazepine and is a benign EEG pattern.   Melissa Vaughan 

## 2020-12-24 NOTE — Progress Notes (Signed)
Headache resolved. Of note, BP 145/103 down from SBP of 202.Marland Kitchen Pt still c/o left hand cramping. Hasn't changed. Call bell placed within reach. Will continue to monitor and maintain safety.

## 2020-12-24 NOTE — Progress Notes (Addendum)
Neurology Progress Note  S: Continues to complain of left hand pain. RN stated that she has continuously complained of her left hand hurting and avoids doing any activity with LUE. RN stated he gave her Tylenol recently and she stated the pain resolved. However, on NP history patient stated pain was a 8/10 before she received Tylenol and is still a 8/10.   She is also c/o left frontal HA which she states is as bad as her left hand.   O: Current vital signs: BP (!) 175/82 (BP Location: Left Arm)   Pulse 66   Temp 97.7 F (36.5 C) (Oral)   Resp 16   Ht 5\' 3"  (1.6 m)   Wt 69.6 kg   LMP  (LMP Unknown)   SpO2 100%   BMI 27.18 kg/m  Vital signs in last 24 hours: Temp:  [97.7 F (36.5 C)-98.9 F (37.2 C)] 97.7 F (36.5 C) (06/07 0830) Pulse Rate:  [58-82] 66 (06/07 0945) Resp:  [14-20] 16 (06/07 0830) BP: (146-200)/(82-142) 175/82 (06/07 0945) SpO2:  [100 %] 100 % (06/07 0830) Weight:  [69.6 kg] 69.6 kg (06/06 1400)  GENERAL: Awake, alert in NAD. HEENT: Normocephalic and atraumatic. LUNGS: Normal respiratory effort.  EXT: guards left hand.   NEURO:  Mental Status: AA&Ox3  Speech/Language: speech is without aphasia or dysarthria. Fluency intact.   Brief neuro exam: Eyes elevated symmetrically. Sensation is heightened to left hand in a glove pattern. No sensation changes above the wrist. Strength is 5/5 except Left hand grip is 3+/5 with effort against gravity. She is able to lift LUE without difficulty and without noted tremor. She is able to spread her fingers. Bulk is normal.   Medications  Current Facility-Administered Medications:  .  0.9 %  sodium chloride infusion, , Intravenous, Continuous, 05-12-1993 T, MD, Last Rate: 100 mL/hr at 12/24/20 0504, Infusion Verify at 12/24/20 0504 .  acetaminophen (TYLENOL) tablet 650 mg, 650 mg, Oral, Q4H PRN, 650 mg at 12/24/20 0841 **OR** acetaminophen (TYLENOL) 160 MG/5ML solution 650 mg, 650 mg, Per Tube, Q4H PRN **OR** acetaminophen  (TYLENOL) suppository 650 mg, 650 mg, Rectal, Q4H PRN, 02/23/21 T, MD .  aspirin EC tablet 81 mg, 81 mg, Oral, Daily, Mikey [employer] T, MD, 81 mg at 12/24/20 0841 .  atorvastatin (LIPITOR) tablet 20 mg, 20 mg, Oral, Daily, 02/23/21 T, MD, 20 mg at 12/24/20 0840 .  clopidogrel (PLAVIX) tablet 75 mg, 75 mg, Oral, Daily, 02/23/21 T, MD, 75 mg at 12/24/20 0841 .  dolutegravir (TIVICAY) tablet 50 mg, 50 mg, Oral, Daily, 02/23/21 T, MD, 50 mg at 12/24/20 0843 .  emtricitabine-tenofovir AF (DESCOVY) 200-25 MG per tablet 1 tablet, 1 tablet, Oral, Daily, 02/23/21, MD, 1 tablet at 12/24/20 [phone] .  enoxaparin (LOVENOX) injection 40 mg, 40 mg, Subcutaneous, Q24H, 6384 T, MD, 40 mg at 12/23/20 2313 .  hydrALAZINE (APRESOLINE) tablet 25 mg, 25 mg, Oral, Q6H PRN, 02/22/21 T, MD, 25 mg at 12/24/20 0840 .  lisinopril (ZESTRIL) tablet 20 mg, 20 mg, Oral, Daily, 20 mg at 12/24/20 0841 **AND** hydrochlorothiazide (MICROZIDE) capsule 12.5 mg, 12.5 mg, Oral, Daily, 02/23/21 T, MD, 12.5 mg at 12/24/20 0840 .  insulin aspart (novoLOG) injection 0-15 Units, 0-15 Units, Subcutaneous, TID WC, 09-18-1984, MD, 2 Units at 12/24/20 [phone] .  nicotine (NICODERM CQ - dosed in mg/24 hours) patch 21 mg, 21 mg, Transdermal, Daily, 6659 T, MD, 21 mg at 12/24/20 0845 .  senna-docusate (Senokot-S) tablet 1 tablet, 1 tablet, Oral, QHS PRN, Emeline General, MD  Pertinent Labs Calcium 8.9    Imaging Code stroke was negative and imaging without acute strokes but with evidence of remote strokes.   Assessment: 50 yo female who presented from [facility] UC as a code stroke for HA, HTN (up in 200s) and left hand pain/cramping. Patient's stroke risk factors include prior stroke, HLD, DM II, and uncontrolled HTN. Though, there was a possible acute infarct in the right perironlandic region near the sesorimotor hand region, it was felt this was old infarct. Due to her symptoms resolving after 1mg  Ativan on 12/23/20, seizure  was on the differential but her EEG is negative today. Dystonia is not as prominent as yesterday, but still complaining of significant pain. Given that tetany can be caused by depletion of calcium or magnesium, will check Mg. Also, is related to thyroid/parathyroid disorders, so check TSH. If MD finds this is dystonia, will recommend treatments as per below.   Recommendations/Plan: -EEG negative and we are discussing need for out patient AEDs. -Check TSH and Magnesium.  -Continue OT. -Given remote strokes, continue secondary stroke prevention with Plavix and ASA per home doses.   Pt seen by 02/22/21, MSN, APN-BC/Nurse Practitioner/Neuro and later by MD.  Neurology Attending Attestation  I discussed the plan with Ms. Jimmye Norman NP and independently evaluated patient at bedside. On my examination she had hyperesthesia on her L hand in a patchy and inconsistently reproducible distribution. She cried out when I touched her hand gently but was able to touch things with her hand without grimace or apparent distress. She reports that her hand feels crampy and similar to yesterday but on today's exam at least she does not have dystonia, spasm, or abnl tone. EEG negative for epileptiform abnl. No indication for AED at this time. I explained to her that I don't think she needs any further outpatient workup from a neuro standpoint which she was very amenable to. We agreed I would put in an amb referral to outpatient neurology for her to f/u if she still has bothersome sx. Neurology to sign off; please re-engage if new neurologic concerns arise.  Tereso Newcomer, MD [facility] [phone]  If 7pm- 7am, please page neurology on call as listed in AMION.

## 2020-12-25 DIAGNOSIS — R252 Cramp and spasm: Secondary | ICD-10-CM

## 2020-12-25 DIAGNOSIS — R203 Hyperesthesia: Secondary | ICD-10-CM

## 2020-12-25 DIAGNOSIS — I10 Essential (primary) hypertension: Secondary | ICD-10-CM

## 2020-12-25 DIAGNOSIS — B20 Human immunodeficiency virus [HIV] disease: Secondary | ICD-10-CM

## 2020-12-25 LAB — HEMOGLOBIN A1C
Hgb A1c MFr Bld: 8.6 % — ABNORMAL HIGH (ref 4.8–5.6)
Hgb A1c MFr Bld: 8.5 % — ABNORMAL HIGH (ref 4.8–5.6)
Mean Plasma Glucose: 200 mg/dL
Mean Plasma Glucose: 197 mg/dL

## 2020-12-25 LAB — BASIC METABOLIC PANEL
Anion gap: 9 (ref 5–15)
BUN: 11 mg/dL (ref 6–20)
CO2: 22 mmol/L (ref 22–32)
Calcium: 9 mg/dL (ref 8.9–10.3)
Chloride: 105 mmol/L (ref 98–111)
Creatinine, Ser: 1.04 mg/dL — ABNORMAL HIGH (ref 0.44–1.00)
GFR, Estimated: >60 mL/min (ref >60)
Glucose, Bld: 144 mg/dL — ABNORMAL HIGH (ref 70–99)
Potassium: 3.4 mmol/L — ABNORMAL LOW (ref 3.5–5.1)
Sodium: 136 mmol/L (ref 135–145)

## 2020-12-25 LAB — MAGNESIUM: Magnesium: 2 mg/dL (ref 1.7–2.4)

## 2020-12-25 LAB — CBC
HCT: 44.5 % (ref 36.0–46.0)
Hemoglobin: 14.3 g/dL (ref 12.0–15.0)
MCH: 28.4 pg (ref 26.0–34.0)
MCHC: 32.1 g/dL (ref 30.0–36.0)
MCV: 88.3 fL (ref 80.0–100.0)
Platelets: 276 10*3/uL (ref 150–400)
RBC: 5.04 MIL/uL (ref 3.87–5.11)
RDW: 13.2 % (ref 11.5–15.5)
WBC: 4.7 10*3/uL (ref 4.0–10.5)
nRBC: 0 % (ref 0.0–0.2)

## 2020-12-25 LAB — PHOSPHORUS: Phosphorus: 3.5 mg/dL (ref 2.5–4.6)

## 2020-12-25 LAB — GLUCOSE, CAPILLARY
Glucose-Capillary: 208 mg/dL — ABNORMAL HIGH (ref 70–99)
Glucose-Capillary: 134 mg/dL — ABNORMAL HIGH (ref 70–99)

## 2020-12-25 MED ORDER — NICOTINE 21 MG/24HR TD PT24: 21.0000 mg | MEDICATED_PATCH | Freq: Every day | TRANSDERMAL | 0 refills | Status: AC

## 2020-12-25 MED ORDER — ONDANSETRON HCL 4 MG PO TABS
4.0000 mg | ORAL_TABLET | Freq: Four times a day (QID) | ORAL | Status: DC | PRN
  Administered 2020-12-25: 4 mg via ORAL
  Filled 2020-12-25: qty 1

## 2020-12-25 MED ORDER — ONDANSETRON HCL 4 MG/2ML IJ SOLN: 4.0000 mg | Freq: Four times a day (QID) | INTRAMUSCULAR | Status: DC | PRN

## 2020-12-25 MED ORDER — POTASSIUM CHLORIDE CRYS ER 20 MEQ PO TBCR
40.0000 meq | EXTENDED_RELEASE_TABLET | Freq: Once | ORAL | Status: AC
  Administered 2020-12-25: 40 meq via ORAL
  Filled 2020-12-25: qty 2

## 2020-12-25 NOTE — Plan of Care (Signed)

## 2020-12-25 NOTE — TOC Transition Note (Signed)
Transition of Care [facility]) - CM/SW Discharge Note   Patient Details  Name: Melissa Vaughan MRN: [id number] Date of Birth: 1971-01-20  Transition of Care [facility]) CM/SW Contact:  Kermit Balo, RN Phone Number: 12/25/2020, 11:14 AM   Clinical Narrative:    Patient states she lives with her daughter that works. Recommendations are for outpatient therapy. Referral sent to [facility] and they will contact the patient for the first appointment. Pt states she has a PCP in [facility]. Pt encouraged to schedule an appointment soon after d/c.  Pt denies issues with home medications or transportation.   Final next level of care: OP Rehab Barriers to Discharge: No Barriers Identified   Patient Goals and CMS Choice     Choice offered to / list presented to : Patient  Discharge Placement                       Discharge Plan and Services                                     Social Determinants of Health (SDOH) Interventions     Readmission Risk Interventions No flowsheet data found.

## 2020-12-25 NOTE — Evaluation (Signed)
Occupational Therapy Evaluation Patient Details Name: Melissa Vaughan MRN: [id number] DOB: June 03, 1971 Today's Date: 12/25/2020    History of Present Illness 50 y.o. female with medical history significant of HIV on HAART, HTN, history of stroke, HLD, IIDM, came with new onset of left forearm and hand cramping. Patient feeling headache, global ache, denied any blurry vision feeling nauseous.  MRI suggests: Remote lacunar infarcts in the right corona radiata and left  caudate head.   Clinical Impression   Patient admitted with the diagnosis above.  PTA she lives in an apartment with her daughter.  Her daughter is available PRN to assist, and does all community mobility, as the patient does not drive.  PTA she was independent with all mobility and self care.  Barriers are listed below.  Currently she is supervision with all in room mobility and ADL completion.  OT to follow in the acute setting to maximize her functional status, no post acute OT is anticipated.      Follow Up Recommendations  No OT follow up    Equipment Recommendations  None recommended by OT    Recommendations for Other Services       Precautions / Restrictions Precautions Precautions: Fall Restrictions Weight Bearing Restrictions: No      Mobility Bed Mobility Overal bed mobility: Independent               Patient Response: Flat affect  Transfers Overall transfer level: Needs assistance   Transfers: Sit to/from Stand;Stand Pivot Transfers Sit to Stand: Supervision Stand pivot transfers: Supervision            Balance Overall balance assessment: Mild deficits observed, not formally tested                                         ADL either performed or assessed with clinical judgement   ADL Overall ADL's : Needs assistance/impaired Eating/Feeding: Independent   Grooming: Wash/dry hands;Wash/dry face;Oral care;Supervision/safety;Standing           Upper Body Dressing :  Supervision/safety;Sitting   Lower Body Dressing: Supervision/safety;Sit to/from stand   Toilet Transfer: Supervision/safety;Ambulation   Toileting- Clothing Manipulation and Hygiene: Independent;Sitting/lateral lean       Functional mobility during ADLs: Supervision/safety       Vision Patient Visual Report: No change from baseline       Perception     Praxis      Pertinent Vitals/Pain Pain Assessment: Faces Pain Score: 2  Faces Pain Scale: Hurts a little bit Pain Location: L hand Pain Descriptors / Indicators: Other (Comment);Cramping Pain Intervention(s): Monitored during session     Hand Dominance Right   Extremity/Trunk Assessment Upper Extremity Assessment Upper Extremity Assessment: Generalized weakness;LUE deficits/detail LUE Deficits / Details: continues to c/p pain and cramps around L thumb LUE Sensation: WNL LUE Coordination: WNL   Lower Extremity Assessment Lower Extremity Assessment: Defer to PT evaluation   Cervical / Trunk Assessment Cervical / Trunk Assessment: Normal   Communication     Cognition Arousal/Alertness: Awake/alert Behavior During Therapy: WFL for tasks assessed/performed Overall Cognitive Status: Within Functional Limits for tasks assessed                                 General Comments: patient with slowed responses, but following commands well and is oriented x4.  No real safety  concerns noted.   General Comments       Exercises     Shoulder Instructions      Home Living Family/patient expects to be discharged to:: Private residence Living Arrangements: Children Available Help at Discharge: Family;Available PRN/intermittently Type of Home: Apartment Home Access: Level entry     Home Layout: One level     Bathroom Shower/Tub: Chief Strategy Officer: Standard     Home Equipment: None      Lives With: Daughter    Prior Functioning/Environment Level of Independence: Independent         Comments: daughter drives her        OT Problem List: Decreased strength;Impaired balance (sitting and/or standing);Pain      OT Treatment/Interventions: Self-care/ADL training;Therapeutic exercise;Balance training;Therapeutic activities    OT Goals(Current goals can be found in the care plan section) Acute Rehab OT Goals Patient Stated Goal: return home OT Goal Formulation: With patient Time For Goal Achievement: 01/08/21 Potential to Achieve Goals: Good ADL Goals Pt Will Perform Grooming: Independently;standing Pt Will Perform Lower Body Bathing: Independently;sit to/from stand Pt Will Perform Lower Body Dressing: Independently;sit to/from stand Pt Will Transfer to Toilet: Independently;ambulating;regular height toilet  OT Frequency: Min 2X/week   Barriers to D/C:            Co-evaluation              AM-PAC OT "6 Clicks" Daily Activity     Outcome Measure Help from another person eating meals?: None Help from another person taking care of personal grooming?: None Help from another person toileting, which includes using toliet, bedpan, or urinal?: A Little Help from another person bathing (including washing, rinsing, drying)?: A Little Help from another person to put on and taking off regular upper body clothing?: None Help from another person to put on and taking off regular lower body clothing?: A Little 6 Click Score: 21   End of Session Nurse Communication: Mobility status  Activity Tolerance: Patient tolerated treatment well Patient left: in chair;with call bell/phone within reach;with chair alarm set  OT Visit Diagnosis: Unsteadiness on feet (R26.81);Muscle weakness (generalized) (M62.81)                Time: 6378-5885 OT Time Calculation (min): 20 min Charges:  OT General Charges $OT Visit: 1 Visit OT Evaluation $OT Eval Moderate Complexity: 1 Mod  12/25/2020  Rich, OTR/L  Acute Rehabilitation Services  Office:  [phone]   Suzanna Obey 12/25/2020, 9:57 AM

## 2020-12-25 NOTE — Evaluation (Signed)
Physical Therapy Evaluation Patient Details Name: Melissa Vaughan MRN: [id number] DOB: November 12, 1970 Today's Date: 12/25/2020   History of Present Illness  50 y/o female presented to urgent care on 6/6 with complaints of L sided headache and L hand cramping after being seen at dentist office where her systolic BP readings in 200s. MRI with no acute abnormalities but evidence of remote strokes. EEG negative PMH: HIV, type 2 DM, HTN, smoker  Clinical Impression  PTA, patient lives with daughter and reports independence with mobility. Patient currently functioning at supervision level for mobility with no AD. Difficult to assess MMT as patient not fully participating. Patient with no overt LOB during ambulation but drifts L/R with decreased stance time on L and decreased gait speed. Patient presents with generalized weakness, impaired balance, and decreased activity tolerance. Patient will benefit from skilled PT services during acute stay to address listed deficits. Recommend OPPT following discharge to address strength and balance deficits.     Follow Up Recommendations Outpatient PT;Supervision - Intermittent    Equipment Recommendations  None recommended by PT    Recommendations for Other Services       Precautions / Restrictions Precautions Precautions: Fall Restrictions Weight Bearing Restrictions: No      Mobility  Bed Mobility Overal bed mobility: Modified Independent                  Transfers Overall transfer level: Needs assistance Equipment used: None Transfers: Sit to/from Stand Sit to Stand: Supervision Stand pivot transfers: Supervision       General transfer comment: supervision for safety  Ambulation/Gait Ambulation/Gait assistance: Supervision Gait Distance (Feet): 200 Feet Assistive device: None Gait Pattern/deviations: Step-through pattern;Decreased weight shift to left;Decreased stance time - left;Decreased stride length;Drifts right/left;Narrow base  of support Gait velocity: decreased   General Gait Details: slow guarded gait. Drifting L/R throughout but no LOB noted  Stairs            Wheelchair Mobility    Modified Rankin (Stroke Patients Only)       Balance Overall balance assessment: Mild deficits observed, not formally tested                                           Pertinent Vitals/Pain Pain Assessment: 0-10 Pain Score: 8  Faces Pain Scale: Hurts a little bit Pain Location: L hand Pain Descriptors / Indicators: Other (Comment);Cramping (intermittent) Pain Intervention(s): Monitored during session    Home Living Family/patient expects to be discharged to:: Private residence Living Arrangements: Children Available Help at Discharge: Family;Available PRN/intermittently Type of Home: Apartment Home Access: Level entry     Home Layout: One level Home Equipment: None      Prior Function Level of Independence: Independent         Comments: daughter drives her     Hand Dominance   Dominant Hand: Right    Extremity/Trunk Assessment   Upper Extremity Assessment Upper Extremity Assessment: Defer to OT evaluation LUE Deficits / Details: continues to c/p pain and cramps around L thumb LUE Sensation: WNL LUE Coordination: WNL    Lower Extremity Assessment Lower Extremity Assessment: Generalized weakness (Difficult to assess MMT as patient not participatory)    Cervical / Trunk Assessment Cervical / Trunk Assessment: Normal  Communication   Communication: No difficulties  Cognition Arousal/Alertness: Awake/alert Behavior During Therapy: WFL for tasks assessed/performed Overall Cognitive Status: Within Functional Limits  for tasks assessed                                 General Comments: patient with slowed responses, but following commands well and is oriented x4.      General Comments      Exercises     Assessment/Plan    PT Assessment Patient  needs continued PT services  PT Problem List Decreased strength;Decreased activity tolerance;Decreased balance;Decreased mobility       PT Treatment Interventions DME instruction;Stair training;Gait training;Functional mobility training;Therapeutic activities;Therapeutic exercise;Neuromuscular re-education;Balance training;Patient/family education    PT Goals (Current goals can be found in the Care Plan section)  Acute Rehab PT Goals Patient Stated Goal: return home PT Goal Formulation: With patient Time For Goal Achievement: 01/08/21 Potential to Achieve Goals: Good    Frequency Min 3X/week   Barriers to discharge        Co-evaluation               AM-PAC PT "6 Clicks" Mobility  Outcome Measure Help needed turning from your back to your side while in a flat bed without using bedrails?: None Help needed moving from lying on your back to sitting on the side of a flat bed without using bedrails?: None Help needed moving to and from a bed to a chair (including a wheelchair)?: A Little Help needed standing up from a chair using your arms (e.g., wheelchair or bedside chair)?: A Little Help needed to walk in hospital room?: A Little Help needed climbing 3-5 steps with a railing? : A Little 6 Click Score: 20    End of Session Equipment Utilized During Treatment: Gait belt Activity Tolerance: Patient tolerated treatment well Patient left: in bed;with call bell/phone within reach;with bed alarm set Nurse Communication: Mobility status PT Visit Diagnosis: Unsteadiness on feet (R26.81);Muscle weakness (generalized) (M62.81)    Time: 6967-8938 PT Time Calculation (min) (ACUTE ONLY): 14 min   Charges:   PT Evaluation $PT Eval Low Complexity: 1 Low          Sanaai Doane A. Dan Humphreys PT, DPT Acute Rehabilitation Services Pager [phone] Office [phone]   Viviann Spare 12/25/2020, 10:13 AM

## 2020-12-25 NOTE — Discharge Summary (Addendum)
Physician Discharge Summary  [name]:[id number] DOB: 11/09/1970 DOA: 12/23/2020  PCP: Pcp, No  Admit date: 12/23/2020 Discharge date: 12/25/2020  Admitted From: Home  Discharge disposition: Home  Recommendations for Outpatient Follow-Up:   Follow up with your primary care provider in one week.  Check CBC, BMP, magnesium in the next visit  Discharge Diagnosis:   Principal Problem:   Hyperesthesia Active Problems:   Human immunodeficiency virus disease (HCC)   Essential hypertension   Type 2 diabetes mellitus without complication, without long-term current use of insulin (HCC)   Hypertensive urgency   Hand cramp    Discharge Condition: Improved.  Diet recommendation: Low sodium, heart healthy.  Carbohydrate-modified.   Wound care: None.  Code status: Full.   History of Present Illness:     Melissa Vaughan is a 50 y.o. female with medical history significant of HIV on HAART, HTN, history of stroke, HLD, IIDM, presented to the hospital with new onset of left forearm and hand cramping with headache nausea.  Blood pressure was very elevated.  Patient was then brought into the hospital CT head scan showed suspected acute right perirolandic infarct near the sensorimotor and region.  Neurology was consulted.  There was some concern for focal seizure so 1 dose of Keppra was given.  Patient was then admitted to hospital for further evaluation and treatment.   Hospital Course:   Following conditions were addressed during hospitalization as listed below,  Left forearm and hand numbness and cramping MRI of the brain showed no acute infarct.  Remote lacunar infarcts in the right corona radiata and left caudate head.  EEG of the brain was negative.      Patient was initially on aspirin Lipitor Plavix.  Hemoglobin A1c 5..  Cholesterol profile reviewed.    Neurology recommends outpatient follow-up with [facility] neurology in 3 to 4 weeks. patient was advised to follow-up with  her primary care patient as outpatient.  No indication for stroke or need for seizure medication at this time   HTN uncontrolled On presentation. .  Continue lisinopril hydrochlorothiazide on discharge.  Blood pressure has improved at this time  Diabetes mellitus type 2.  Resume diabetic diet and metformin   HIV on HAART  patient's reports compliance with medications.  Follows up with [facility].   Cigarette smoker Continue nicotine patch on discharge.  Disposition.  At this time, patient is stable for disposition home with outpatient PCP and neurology follow-up.  Medical Consultants:   Neurology  Procedures:    EEG Subjective:   Today, patient was seen and examined at bedside.  Complains of mild discomfort on the left.  Discharge Exam:   Vitals:   12/25/20 0425 12/25/20 0718  BP: (!) 150/108 (!) 155/107  Pulse: 68 61  Resp: 18 18  Temp: 98.2 F (36.8 C) 98.2 F (36.8 C)  SpO2: 100% 100%   Vitals:   12/24/20 1930 12/24/20 2343 12/25/20 0425 12/25/20 0718  BP: (!) 157/89 (!) 158/109 (!) 150/108 (!) 155/107  Pulse: (!) 55 (!) 57 68 61  Resp: Temp: 98.3 F (36.8 C) 98.4 F (36.9 C) 98.2 F (36.8 C) 98.2 F (36.8 C)  TempSrc: Oral  Oral Oral  SpO2: 100% 100% 100% 100%  Weight:      Height:        General: Alert awake, not in obvious distress HENT: pupils equally reacting to light,  No scleral pallor or icterus noted. Oral mucosa is moist.  Chest:  Clear breath sounds.  Diminished breath sounds bilaterally. No crackles or wheezes.  CVS: S1 &S2 heard. No murmur.  Regular rate and rhythm. Abdomen: Soft, nontender, nondistended.  Bowel sounds are heard.   Extremities: No cyanosis, clubbing or edema.  Peripheral pulses are palpable.  Mild left arm tenderness on palpation Psych: Alert, awake and oriented, normal mood CNS:  No cranial nerve deficits.  Power equal in all extremities.   Skin: Warm and dry.  No rashes noted.  The results of  significant diagnostics from this hospitalization (including imaging, microbiology, ancillary and laboratory) are listed below for reference.     Diagnostic Studies:   MR BRAIN WO CONTRAST  Result Date: 12/23/2020 CLINICAL DATA:  Neuro deficit, acute, stroke suspected. EXAM: MRI HEAD WITHOUT CONTRAST TECHNIQUE: Multiplanar, multiecho pulse sequences of the brain and surrounding structures were obtained without intravenous contrast. COMPARISON:  Head CT December 23, 2020 FINDINGS: Brain: No acute infarction, hemorrhage, hydrocephalus, extra-axial collection or mass lesion. Remote lacunar infarcts in the right corona radiata and left caudate head. Scattered foci of T2 hyperintensity are seen within the white matter of the cerebral hemispheres, nonspecific. Vascular: Normal flow voids. Skull and upper cervical spine: Normal marrow signal. Sinuses/Orbits: Bilateral lens surgery. Trace mucosal thickening throughout the paranasal sinuses. Other: None. IMPRESSION: 1. No acute intracranial abnormality. 2. Remote lacunar infarcts in the right corona radiata and left caudate head. 3. Small amount of nonspecific T2 hyperintense lesions of the white matter. Electronically Signed   By: Baldemar Lenis M.D.   On: 12/23/2020 17:23   EEG adult  Result Date: 12/24/2020 Charlsie Quest, MD     12/24/2020  8:48 AM Patient Name: Melissa Vaughan MRN: [id number] Epilepsy Attending: Charlsie Quest Referring Physician/Provider: Jimmye Norman, NP Date: 12/23/2020 Duration: 22.08 mins Patient history: 50 yo female who presented for HA, HTN (up in 200s) and left hand pain/cramping. EEG to evaluate for seizure Level of alertness: Awake AEDs during EEG study: None Technical aspects: This EEG study was done with scalp electrodes positioned according to the 10-20 International system of electrode placement. Electrical activity was acquired at a sampling rate of 500Hz  and reviewed with a high frequency filter of 70Hz  and a  low frequency filter of 1Hz . EEG data were recorded continuously and digitally stored. Description: The posterior dominant rhythm consists of 9 Hz activity of moderate voltage (25-35 uV) seen predominantly in posterior head regions, symmetric and reactive to eye opening and eye closing. There is an excessive amount of 15 to 18 Hz beta activity distributed symmetrically and diffusely.  Hyperventilation and photic stimulation were not performed.   Of note, EEG was technically difficult due to significant myogenic artifact ABNORMALITY - Excessive beta, generalized IMPRESSION: This technically difficult  study is within normal limits. No seizures or epileptiform discharges were seen throughout the recording. The excessive beta activity seen in the background is most likely due to the effect of benzodiazepine and is a benign EEG pattern.   ECHOCARDIOGRAM COMPLETE  Result Date: 12/23/2020    ECHOCARDIOGRAM REPORT   Patient Name:   CAMRIE STOCK Date of Exam: 12/23/2020 Medical Rec #:  [id number]/22/[id number]        Height:       63.0 in Accession #:    Dairl Ponder       Weight:       153.4 lb Date of Birth:  Dec 04, 1970         BSA:  1.728 m Patient Age:    50 years         BP:           158/118 mmHg Patient Gender: F                HR:           65 bpm. Exam Location:  Inpatient Procedure: 2D Echo, Cardiac Doppler and Color Doppler Indications:    TIA G45.9  History:        Patient has no prior history of Echocardiogram examinations.  Sonographer:    Roosvelt Maser RDCS Referring Phys: [id number] Emeline General IMPRESSIONS  1. Left ventricular ejection fraction, by estimation, is 60 to 65%. The left ventricle has normal function. The left ventricle has no regional wall motion abnormalities. There is mild concentric left ventricular hypertrophy. Left ventricular diastolic parameters are consistent with Grade II diastolic dysfunction (pseudonormalization).  2. Right ventricular systolic function is normal. The right  ventricular size is normal. Tricuspid regurgitation signal is inadequate for assessing PA pressure.  3. Left atrial size was moderately dilated.  4. Right atrial size was mildly dilated.  5. A small pericardial effusion is present. The pericardial effusion is circumferential.  6. The mitral valve is normal in structure. No evidence of mitral valve regurgitation. No evidence of mitral stenosis.  7. The aortic valve is tricuspid. Aortic valve regurgitation is not visualized. No aortic stenosis is present.  8. The inferior vena cava is normal in size with greater than 50% respiratory variability, suggesting right atrial pressure of 3 mmHg. Comparison(s): No prior Echocardiogram. Conclusion(s)/Recommendation(s): Otherwise normal echocardiogram, with minor abnormalities described in the report. FINDINGS  Left Ventricle: Left ventricular ejection fraction, by estimation, is 60 to 65%. The left ventricle has normal function. The left ventricle has no regional wall motion abnormalities. The left ventricular internal cavity size was normal in size. There is  mild concentric left ventricular hypertrophy. Left ventricular diastolic parameters are consistent with Grade II diastolic dysfunction (pseudonormalization). Right Ventricle: The right ventricular size is normal. No increase in right ventricular wall thickness. Right ventricular systolic function is normal. Tricuspid regurgitation signal is inadequate for assessing PA pressure. Left Atrium: Left atrial size was moderately dilated. Right Atrium: Right atrial size was mildly dilated. Pericardium: A small pericardial effusion is present. The pericardial effusion is circumferential. Mitral Valve: The mitral valve is normal in structure. No evidence of mitral valve regurgitation. No evidence of mitral valve stenosis. Tricuspid Valve: The tricuspid valve is normal in structure. Tricuspid valve regurgitation is trivial. No evidence of tricuspid stenosis. Aortic Valve: The  aortic valve is tricuspid. Aortic valve regurgitation is not visualized. No aortic stenosis is present. Aortic valve mean gradient measures 4.0 mmHg. Aortic valve peak gradient measures 7.0 mmHg. Aortic valve area, by VTI measures 1.99 cm. Pulmonic Valve: The pulmonic valve was grossly normal. Pulmonic valve regurgitation is trivial. No evidence of pulmonic stenosis. Aorta: The aortic root and ascending aorta are structurally normal, with no evidence of dilitation. Venous: The inferior vena cava is normal in size with greater than 50% respiratory variability, suggesting right atrial pressure of 3 mmHg. IAS/Shunts: The atrial septum is grossly normal.  LEFT VENTRICLE PLAX 2D LVIDd:         3.95 cm  Diastology LVIDs:         2.70 cm  LV e' medial:    5.77 cm/s LV PW:         1.10 cm  LV E/e' medial:  16.3 LV IVS:        1.10 cm  LV e' lateral:   9.90 cm/s LVOT diam:     1.90 cm  LV E/e' lateral: 9.5 LV SV:         60 LV SV Index:   35 LVOT Area:     2.84 cm  RIGHT VENTRICLE RV Basal diam:  3.30 cm LEFT ATRIUM             Index       RIGHT ATRIUM           Index LA diam:        3.20 cm 1.85 cm/m  RA Area:     14.20 cm LA Vol (A2C):   81.2 ml 47.00 ml/m RA Volume:   36.30 ml  21.01 ml/m LA Vol (A4C):   52.2 ml 30.21 ml/m LA Biplane Vol: 68.1 ml 39.42 ml/m  AORTIC VALVE AV Area (Vmax):    2.19 cm AV Area (Vmean):   2.03 cm AV Area (VTI):     1.99 cm AV Vmax:           132.00 cm/s AV Vmean:          87.000 cm/s AV VTI:            0.303 m AV Peak Grad:      7.0 mmHg AV Mean Grad:      4.0 mmHg LVOT Vmax:         102.00 cm/s LVOT Vmean:        62.200 cm/s LVOT VTI:          0.213 m LVOT/AV VTI ratio: 0.70  AORTA Ao Root diam: 2.45 cm Ao Asc diam:  3.10 cm MITRAL VALVE MV Area (PHT): 4.49 cm    SHUNTS MV Decel Time: 169 msec    Systemic VTI:  0.21 m MV E velocity: 94.10 cm/s  Systemic Diam: 1.90 cm MV A velocity: 86.30 cm/s MV E/A ratio:  1.09 Jodelle Red MD Electronically signed by Jodelle Red MD Signature Date/Time: 12/23/2020/6:52:53 PM    Final    CT HEAD CODE STROKE WO CONTRAST  Result Date: 12/23/2020 CLINICAL DATA:  Code stroke.  Left hand numbness EXAM: CT HEAD WITHOUT CONTRAST TECHNIQUE: Contiguous axial images were obtained from the base of the skull through the vertex without intravenous contrast. COMPARISON:  10/31/2020 FINDINGS: Brain: No acute intracranial hemorrhage or mass effect. Possible new small area of loss of gray-white differentiation in the right perirolandic region near the sensorimotor hand region. Chronic infarcts of the right greater than left basal ganglia and adjacent white matter. Ventricles and sulci are within normal limits in size and configuration. No extra-axial collection. Vascular: No hyperdense vessel. Skull: Unremarkable. Sinuses/Orbits: No acute abnormality. Other: Mastoid air cells are clear. ASPECTS [facility] Stroke Program Early CT Score) - Ganglionic level infarction (caudate, lentiform nuclei, internal capsule, insula, M1-M3 cortex): 7 - Supraganglionic infarction (M4-M6 cortex): 2 Total score (0-10 with 10 being normal): 9 IMPRESSION: Question of a small acute right perirolandic infarct near the sensorimotor hand region. This is not confirmed in all planes and could reflect volume averaging with the sulcus. No acute intracranial hemorrhage. Stable chronic findings detailed above. These results were communicated to Dr. Pearlean Brownie at 2:15 pm on 12/23/2020 by text page via the [facility] messaging system. Electronically Signed   By: Guadlupe Spanish M.D.   On: 12/23/2020 14:19     Labs:   Basic Metabolic Panel: Recent Labs  Lab 12/23/20 1402 12/23/20 1408 12/24/20 1248 12/25/20 0504  NA 139 143  --  136  K 3.6 3.6  --  3.4*  CL 109 109  --  105  CO2 21*  --   --  22  GLUCOSE 141* 138*  --  144*  BUN 13 14  --  11  CREATININE 0.99 0.90  --  1.04*  CALCIUM 8.9  --   --  9.0  MG  --   --  2.1 2.0  PHOS  --   --   --  3.5   GFR Estimated  Creatinine Clearance: 61.9 mL/min (A) (by C-G formula based on SCr of 1.04 mg/dL (H)). Liver Function Tests: Recent Labs  Lab 12/23/20 1402  AST 12*  ALT 10  ALKPHOS 110  BILITOT 0.5  PROT 7.7  ALBUMIN 4.1   No results for input(s): LIPASE, AMYLASE in the last 168 hours. No results for input(s): AMMONIA in the last 168 hours. Coagulation profile Recent Labs  Lab 12/23/20 1402  INR 1.0    CBC: Recent Labs  Lab 12/23/20 1402 12/23/20 1408 12/25/20 0504  WBC 6.7  --  4.7  NEUTROABS 3.7  --   --   HGB 13.6 13.9 14.3  HCT 42.2 41.0 44.5  MCV 88.8  --  88.3  PLT 305  --  276   Cardiac Enzymes: No results for input(s): CKTOTAL, CKMB, CKMBINDEX, TROPONINI in the last 168 hours. BNP: Invalid input(s): POCBNP CBG: Recent Labs  Lab 12/24/20 0650 12/24/20 1157 12/24/20 1615 12/24/20 2108 12/25/20 0614  GLUCAP 140* 148* 177* 170* 208*   D-Dimer No results for input(s): DDIMER in the last 72 hours. Hgb A1c Recent Labs    12/23/20 1829 12/24/20 0632  HGBA1C 8.6* 8.5*   Lipid Profile Recent Labs    12/24/20 0632  CHOL 139  HDL 42  LDLCALC 78  TRIG 96  CHOLHDL 3.3   Thyroid function studies Recent Labs    12/24/20 1248  TSH 1.572   Anemia work up No results for input(s): VITAMINB12, FOLATE, FERRITIN, TIBC, IRON, RETICCTPCT in the last 72 hours. Microbiology Recent Results (from the past 240 hour(s))  SARS CORONAVIRUS 2 (TAT 6-24 HRS) Nasopharyngeal Nasopharyngeal Swab     Status: None   Collection Time: 12/23/20  4:00 PM   Specimen: Nasopharyngeal Swab  Result Value Ref Range Status   SARS Coronavirus 2 NEGATIVE NEGATIVE Final    Comment: (NOTE) SARS-CoV-2 target nucleic acids are NOT DETECTED.  The SARS-CoV-2 RNA is generally detectable in upper and lower respiratory specimens during the acute phase of infection. Negative results do not preclude SARS-CoV-2 infection, do not rule out co-infections with other pathogens, and should not be used as  the sole basis for treatment or other patient management decisions. Negative results must be combined with clinical observations, patient history, and epidemiological information. The expected result is Negative.  Fact Sheet for Patients: HairSlick.nohttps://www.fda.gov/media/138098/download  Fact Sheet for Healthcare Providers: quierodirigir.comhttps://www.fda.gov/media/138095/download  This test is not yet approved or cleared by the [place] FDA and  has been authorized for detection and/or diagnosis of SARS-CoV-2 by FDA under an Emergency Use Authorization (EUA). This EUA will remain  in effect (meaning this test can be used) for the duration of the COVID-19 declaration under Se ction 564(b)(1) of the Act, 21 U.S.C. section 360bbb-3(b)(1), unless the authorization is terminated or revoked sooner.  Performed at [facility], [address] [place], [place] [address]   Culture,  Urine     Status: Abnormal (Preliminary result)   Collection Time: 12/24/20 11:44 AM   Specimen: Urine, Clean Catch  Result Value Ref Range Status   Specimen Description URINE, CLEAN CATCH  Final   Special Requests   Final    NONE Performed at [facility], [address] [place], [place] [address]    Culture >=100,000 COLONIES/mL GRAM NEGATIVE RODS (A)  Final   Report Status PENDING  Incomplete     Discharge Instructions:   Discharge Instructions     Ambulatory referral to Neurology   Complete by: As directed    An appointment is requested in approximately: 6-8 wks   Diet - low sodium heart healthy   Complete by: As directed    Discharge instructions   Complete by: As directed    Follow-up with your primary care physician in 1 week.  Follow-up with [facility] in 3 to 4 weeks (office to call you)   Increase activity slowly   Complete by: As directed       Allergies as of 12/25/2020   No Known Allergies      Medication List     TAKE these medications    atorvastatin 20 MG  tablet Commonly known as: LIPITOR Take 20 mg by mouth daily.   clopidogrel 75 MG tablet Commonly known as: PLAVIX Take 75 mg by mouth daily.   dicyclomine 20 MG tablet Commonly known as: BENTYL Take 1 tablet (20 mg total) by mouth 3 (three) times daily as needed for spasms.   emtricitabine-tenofovir AF 200-25 MG tablet Commonly known as: DESCOVY Take 1 tablet by mouth daily.   lisinopril-hydrochlorothiazide 20-12.5 MG tablet Commonly known as: ZESTORETIC Take 1 tablet by mouth daily.   metFORMIN 500 MG tablet Commonly known as: GLUCOPHAGE Take 500 mg by mouth 2 (two) times daily.   nicotine 21 mg/24hr patch Commonly known as: NICODERM CQ - dosed in mg/24 hours Place 1 patch (21 mg total) onto the skin daily.   Tivicay 50 MG tablet Generic drug: dolutegravir Take 50 mg by mouth daily.        Follow-up Information     Primary care provider. Schedule an appointment as soon as possible for a visit in 1 week(s).                   Time coordinating discharge: 39 minutes  Signed:  Elena Davia  [facility] 12/25/2020, 11:12 AM

## 2020-12-26 LAB — URINE CULTURE: Culture: >=100000 — AB

## 2021-04-07 ENCOUNTER — Inpatient Hospital Stay: Payer: Medicaid Other | Admitting: Neurology

## 2021-05-27 ENCOUNTER — Emergency Department (HOSPITAL_COMMUNITY)
Admission: EM | Admit: 2021-05-27 | Discharge: 2021-05-27 | Disposition: A | Discharge status: Home / Self Care | Payer: Medicaid Other | Attending: Emergency Medicine | Admitting: Emergency Medicine

## 2021-05-27 ENCOUNTER — Emergency Department (HOSPITAL_COMMUNITY): Payer: Medicaid Other

## 2021-05-27 DIAGNOSIS — Z79899 Other long term (current) drug therapy: Secondary | ICD-10-CM | POA: Insufficient documentation

## 2021-05-27 DIAGNOSIS — F17210 Nicotine dependence, cigarettes, uncomplicated: Secondary | ICD-10-CM | POA: Diagnosis not present

## 2021-05-27 DIAGNOSIS — I10 Essential (primary) hypertension: Secondary | ICD-10-CM | POA: Insufficient documentation

## 2021-05-27 DIAGNOSIS — Z7984 Long term (current) use of oral hypoglycemic drugs: Secondary | ICD-10-CM | POA: Diagnosis not present

## 2021-05-27 DIAGNOSIS — N3001 Acute cystitis with hematuria: Secondary | ICD-10-CM

## 2021-05-27 DIAGNOSIS — R55 Syncope and collapse: Secondary | ICD-10-CM | POA: Diagnosis present

## 2021-05-27 DIAGNOSIS — E119 Type 2 diabetes mellitus without complications: Secondary | ICD-10-CM | POA: Insufficient documentation

## 2021-05-27 DIAGNOSIS — R531 Weakness: Secondary | ICD-10-CM | POA: Diagnosis not present

## 2021-05-27 LAB — COMPREHENSIVE METABOLIC PANEL
ALT: 9 U/L (ref 0–44)
AST: 12 U/L — ABNORMAL LOW (ref 15–41)
Albumin: 3.9 g/dL (ref 3.5–5.0)
Alkaline Phosphatase: 108 U/L (ref 38–126)
Anion gap: 8 (ref 5–15)
BUN: 9 mg/dL (ref 6–20)
CO2: 22 mmol/L (ref 22–32)
Calcium: 8.7 mg/dL — ABNORMAL LOW (ref 8.9–10.3)
Chloride: 109 mmol/L (ref 98–111)
Creatinine, Ser: 1.2 mg/dL — ABNORMAL HIGH (ref 0.44–1.00)
GFR, Estimated: 56 mL/min — ABNORMAL LOW (ref >60)
Glucose, Bld: 147 mg/dL — ABNORMAL HIGH (ref 70–99)
Potassium: 3.7 mmol/L (ref 3.5–5.1)
Sodium: 139 mmol/L (ref 135–145)
Total Bilirubin: 0.7 mg/dL (ref 0.3–1.2)
Total Protein: 7.6 g/dL (ref 6.5–8.1)

## 2021-05-27 LAB — URINALYSIS, ROUTINE W REFLEX MICROSCOPIC
Bilirubin Urine: NEGATIVE
Glucose, UA: NEGATIVE mg/dL
Ketones, ur: NEGATIVE mg/dL
Nitrite: NEGATIVE
Protein, ur: 30 mg/dL — AB
Specific Gravity, Urine: 1.012 (ref 1.005–1.030)
WBC, UA: >50 WBC/hpf — ABNORMAL HIGH (ref 0–5)
pH: 6 (ref 5.0–8.0)

## 2021-05-27 LAB — TROPONIN I (HIGH SENSITIVITY)
Troponin I (High Sensitivity): 6 ng/L (ref <18)
Troponin I (High Sensitivity): 7 ng/L (ref <18)

## 2021-05-27 LAB — CBC WITH DIFFERENTIAL/PLATELET
Abs Immature Granulocytes: 0.01 10*3/uL (ref 0.00–0.07)
Basophils Absolute: 0 10*3/uL (ref 0.0–0.1)
Basophils Relative: 1 %
Eosinophils Absolute: 0.2 10*3/uL (ref 0.0–0.5)
Eosinophils Relative: 3 %
HCT: 40.1 % (ref 36.0–46.0)
Hemoglobin: 12.8 g/dL (ref 12.0–15.0)
Immature Granulocytes: 0 %
Lymphocytes Relative: 29 %
Lymphs Abs: 1.8 10*3/uL (ref 0.7–4.0)
MCH: 28.3 pg (ref 26.0–34.0)
MCHC: 31.9 g/dL (ref 30.0–36.0)
MCV: 88.5 fL (ref 80.0–100.0)
Monocytes Absolute: 0.3 10*3/uL (ref 0.1–1.0)
Monocytes Relative: 6 %
Neutro Abs: 3.7 10*3/uL (ref 1.7–7.7)
Neutrophils Relative %: 61 %
Platelets: 243 10*3/uL (ref 150–400)
RBC: 4.53 MIL/uL (ref 3.87–5.11)
RDW: 13.5 % (ref 11.5–15.5)
WBC: 6.1 10*3/uL (ref 4.0–10.5)
nRBC: 0 % (ref 0.0–0.2)

## 2021-05-27 LAB — I-STAT BETA HCG BLOOD, ED (MC, WL, AP ONLY): I-stat hCG, quantitative: <5 m[IU]/mL (ref <5)

## 2021-05-27 MED ORDER — LISINOPRIL-HYDROCHLOROTHIAZIDE 20-12.5 MG PO TABS: 1.0000 | ORAL_TABLET | Freq: Every day | ORAL | 0 refills | Status: DC

## 2021-05-27 MED ORDER — SODIUM CHLORIDE 0.9 % IV BOLUS: 1000.0000 mL | Freq: Once | INTRAVENOUS | Status: DC

## 2021-05-27 MED ORDER — SODIUM CHLORIDE 0.9 % IV SOLN
1.0000 g | Freq: Once | INTRAVENOUS | Status: AC
  Administered 2021-05-27: 1 g via INTRAVENOUS
  Filled 2021-05-27: qty 10

## 2021-05-27 MED ORDER — HYDROCHLOROTHIAZIDE 12.5 MG PO TABS
12.5000 mg | ORAL_TABLET | Freq: Every day | ORAL | Status: DC
  Administered 2021-05-27: 12.5 mg via ORAL
  Filled 2021-05-27: qty 1

## 2021-05-27 MED ORDER — LISINOPRIL 20 MG PO TABS
20.0000 mg | ORAL_TABLET | Freq: Once | ORAL | Status: AC
  Administered 2021-05-27: 20 mg via ORAL
  Filled 2021-05-27: qty 1

## 2021-05-27 MED ORDER — SODIUM CHLORIDE 0.9 % IV BOLUS
500.0000 mL | Freq: Once | INTRAVENOUS | Status: AC
  Administered 2021-05-27: 500 mL via INTRAVENOUS

## 2021-05-27 MED ORDER — CEPHALEXIN 500 MG PO CAPS: ORAL_CAPSULE | ORAL | 0 refills | Status: AC

## 2021-05-27 NOTE — ED Notes (Signed)
Notified provider of pt's persistent elevated BP

## 2021-05-27 NOTE — Discharge Instructions (Addendum)
Get help right away if: You faint. You hit your head or are injured after fainting. You have any of these symptoms that may indicate trouble with your heart: Fast or irregular heartbeats (palpitations). Unusual pain in your chest, abdomen, or back. Shortness of breath. You have a seizure. You have a severe headache. You are confused. You have vision problems. You have severe weakness or trouble walking. You are bleeding from your mouth or rectum, or you have black or tarry stool. 

## 2021-05-27 NOTE — ED Triage Notes (Signed)
EMS stated, she normally is weak on the left side.

## 2021-05-27 NOTE — ED Notes (Signed)
RN assisted pt to bedside commode, pt reported feeling dizzy upon standing.

## 2021-05-27 NOTE — ED Notes (Signed)
DC instructions reviewed with pt. PT verbalized understanding. PT DC °

## 2021-05-27 NOTE — ED Provider Notes (Signed)
[facility] EMERGENCY DEPARTMENT Provider Note   CSN: [id number] Arrival date & time: 05/27/21  1152     History Chief Complaint  Patient presents with   Loss of Consciousness    Ceriah Vaughan is a 50 y.o. female with medical history significant of HIV on HAART, HTN, history of stroke, HLD, IIDM. She arrives by EMS for LOC. Hx given by patient and her daughter at bedside. Patient states that the last thing she remebers was getting up to get her eggs on the stove.  Her other daughter was in the kitchen and stated that the patient appeared to pass out. She fell backward, hit her head on the wall and then slid to the floor.  Daughter at bedside said she heard her fall and went to see what was going on.  She states that the patient had some jerking, but when she woke up she was lethargic, but answering questions.  Patient states that she does have a history of seizures and that she is on seizure medication.  Review of EMR shows that when she was admitted in her last hospitalization there is a thought that she was having focal seizures patient was given Keppra and outpatient referral to [facility] neurology.  Does not appear that she is followed with them.  She states that she is compliant with all of her medications.  She is not sure what medication she takes.  Patient also states that she has not been taking her blood pressure medication for the past 2 weeks because she was supposed to get started on a new 1 but " my doctor never called me."  Daughter state that she has had 1 previous episode of syncope a few years ago when she was getting her hair done.  Patient denies melena or hematochezia.   Loss of Consciousness     Past Medical History:  Diagnosis Date   HIV (human immunodeficiency virus infection) (HCC)    Hypertension    Type 2 diabetes mellitus without complication, without long-term current use of insulin (HCC) 10/31/2018    Patient Active Problem List    Diagnosis Date Noted   Hand cramp    Hyperesthesia    Hypertensive urgency    Focal seizure (HCC)    Type 2 diabetes mellitus without complication, without long-term current use of insulin (HCC) 10/31/2018   Chronic abdominal pain 07/23/2016   Mood disorder (HCC) 04/09/2016   Human immunodeficiency virus disease (HCC) 04/29/2011   Essential hypertension 04/29/2011    No past surgical history on file.   OB History   No obstetric history on file.     No family history on file.  Social History   Tobacco Use   Smoking status: Some Days    Types: Cigarettes   Smokeless tobacco: Never  Substance Use Topics   Alcohol use: No   Drug use: No    Home Medications Prior to Admission medications   Medication Sig Start Date End Date Taking? Authorizing Provider  atorvastatin (LIPITOR) 20 MG tablet Take 20 mg by mouth daily. 04/12/17   [provider]  clopidogrel (PLAVIX) 75 MG tablet Take 75 mg by mouth daily.    [provider]  dicyclomine (BENTYL) 20 MG tablet Take 1 tablet (20 mg total) by mouth 3 (three) times daily as needed for spasms. 10/31/20   Long, Arlyss Repress, MD  dolutegravir (TIVICAY) 50 MG tablet Take 50 mg by mouth daily. 03/29/20   [provider]  emtricitabine-tenofovir AF (  DESCOVY) 200-25 MG tablet Take 1 tablet by mouth daily. 12/01/17   [provider]  lisinopril-hydrochlorothiazide (PRINZIDE,ZESTORETIC) 20-12.5 MG tablet Take 1 tablet by mouth daily.    [provider]  metFORMIN (GLUCOPHAGE) 500 MG tablet Take 500 mg by mouth 2 (two) times daily. 02/08/18   [provider]  nicotine (NICODERM CQ - DOSED IN MG/24 HOURS) 21 mg/24hr patch Place 1 patch (21 mg total) onto the skin daily. 12/25/20   Pokhrel, Rebekah Chesterfield, MD    Allergies    Patient has no known allergies.  Review of Systems   Review of Systems  Cardiovascular:  Positive for syncope.  Ten systems reviewed and are negative for acute change, except as noted  in the HPI.   Physical Exam Updated Vital Signs BP (!) 180/119   Pulse 69   Temp 98.2 F (36.8 C) (Oral)   Resp (!) 22   Ht 5\' 4"  (1.626 m)   Wt 70.8 kg   SpO2 100%   BMI 26.78 kg/m   Physical Exam Vitals and nursing note reviewed.  Constitutional:      General: She is not in acute distress.    Appearance: She is well-developed. She is not diaphoretic.  HENT:     Head: Normocephalic and atraumatic.     Right Ear: External ear normal.     Left Ear: External ear normal.     Nose: Nose normal.     Mouth/Throat:     Mouth: Mucous membranes are moist.  Eyes:     General: No scleral icterus.    Conjunctiva/sclera: Conjunctivae normal.  Cardiovascular:     Rate and Rhythm: Normal rate and regular rhythm.     Heart sounds: Normal heart sounds. No murmur heard.   No friction rub. No gallop.  Pulmonary:     Effort: Pulmonary effort is normal. No respiratory distress.     Breath sounds: Normal breath sounds.  Abdominal:     General: Bowel sounds are normal. There is no distension.     Palpations: Abdomen is soft. There is no mass.     Tenderness: There is no abdominal tenderness. There is no guarding.  Musculoskeletal:     Cervical back: Normal range of motion.  Skin:    General: Skin is warm and dry.  Neurological:     Mental Status: She is alert and oriented to person, place, and time.     Sensory: No sensory deficit.     Comments: Weakness -left face, left upper/lower extremity- chronic  Psychiatric:        Behavior: Behavior normal.    ED Results / Procedures / Treatments   Labs (all labs ordered are listed, but only abnormal results are displayed) Labs Reviewed  COMPREHENSIVE METABOLIC PANEL - Abnormal; Notable for the following components:      Result Value   Glucose, Bld 147 (*)    Creatinine, Ser 1.20 (*)    Calcium 8.7 (*)    AST 12 (*)    GFR, Estimated 56 (*)    All other components within normal limits  CBC WITH DIFFERENTIAL/PLATELET  URINALYSIS,  ROUTINE W REFLEX MICROSCOPIC  I-STAT BETA HCG BLOOD, ED ([facility], [facility], [facility] ONLY)  CBG MONITORING, ED  TROPONIN I (HIGH SENSITIVITY)    EKG EKG Interpretation  Date/Time:  Tuesday May 27 2021 11:59:26 EST Ventricular Rate:  74 PR Interval:  138 QRS Duration: 86 QT Interval:  414 QTC Calculation: 459 R Axis:   -17 Text Interpretation: Normal sinus  rhythm Possible Anterior infarct , age undetermined No significant change since last tracing Confirmed by Gwyneth Sprout ([id number]) on 05/27/2021 12:46:56 PM  Radiology CT Head Wo Contrast  Result Date: 05/27/2021 CLINICAL DATA:  Mental status change, unknown cause EXAM: CT HEAD WITHOUT CONTRAST TECHNIQUE: Contiguous axial images were obtained from the base of the skull through the vertex without intravenous contrast. COMPARISON:  June 2022 FINDINGS: Brain: There is no acute intracranial hemorrhage, mass effect, or edema. Gray-white differentiation is preserved. There is no extra-axial fluid collection. Chronic infarcts of basal ganglia bilaterally and right corona radiata. Ventricles and sulci are stable in size and configuration. Vascular: No hyperdense vessel or unexpected calcification. Skull: Calvarium is unremarkable. Sinuses/Orbits: No acute finding. Other: None. IMPRESSION: No acute intracranial abnormality. Stable chronic findings detailed above. Electronically Signed   By: Guadlupe Spanish M.D.   On: 05/27/2021 12:54    Procedures Procedures   Medications Ordered in ED Medications - No data to display  ED Course  I have reviewed the triage vital signs and the nursing notes.  Pertinent labs & imaging results that were available during my care of the patient were reviewed by me and considered in my medical decision making (see chart for details).    MDM Rules/Calculators/A&P                           Patient here with loss of consciousness. The differential for syncope is extensive and includes, but is not limited to: arrythmia  (Vtach, SVT, SSS, sinus arrest, AV block, bradycardia) aortic stenosis, AMI, HOCM, PE, atrial myxoma, pulmonary hypertension, orthostatic hypotension, (hypovolemia, drug effect, GB syndrome, micturition, cough, swall) carotid sinus sensitivity, Seizure, TIA/CVA, hypoglycemia,  Vertigo. I ordered and reviewed labs that include CBC without significant abnormality, CMP with mildly elevated blood glucose, creatinine is elevated but baseline for patient.  Urine appears infected, hCG is negative and troponin is within normal limits.  I ordered and reviewed a head CT which shows no acute abnormalities  EKG shows sinus rhythm at a rate of 74.  Patient hypertensive but orthostatic.  Patient given a little bit of fluid and also given her normal antihypertensive medications.  She is current receiving IV Rocephin for her UTI.  Urine sent for culture.  Handoff given at signout to PA Blyn who will discharge the patient when her IV antibiotics have finished.  I have discussed all findings with the patient at bedside.  She is advised to restart her lisinopril/HCTZ which I have ordered until she follows up with her primary care physician.  Discussed return precautions.  Patient is comfortable with plan at this time. Final Clinical Impression(s) / ED Diagnoses Final diagnoses:  None    Rx / DC Orders ED Discharge Orders     None        Arthor Captain, PA-C 05/28/21 1507    Gwyneth Sprout, MD 06/02/21 [phone]

## 2021-05-27 NOTE — ED Triage Notes (Signed)
EMS stated, family stated she went to kitchen and fell straight down. LOC , hit the back of her head. Easily aroused but slow to respond.

## 2021-05-27 NOTE — ED Provider Notes (Signed)
Emergency Medicine Provider Triage Evaluation Note  Melissa Vaughan , a 50 y.o. female  was evaluated in triage.  Pt complains of syncopal episode.  Patient is slow to respond and most to report obtained from EMS.  EMS states that per family the patient was standing in the kitchen and fell straight backwards with loss of consciousness.  They states she had the back of her head.  States that she was arousable afterward but somewhat slow to respond.  They deny seeing any seizure-like activity, no report of biting her tongue or loss of bowel or bladder function.  Here the patient states she does not remember what happened.  She does states she was dizzy prior to falling but denies palpitations, tunnel vision, diaphoresis prior to the episode.  She appears drowsy.  She has history of strokes and old left-sided facial droop and weakness.  She is unable to provide much more information.  She denies any chest pain or shortness of breath, abdominal pain, difficulty urinating.  Denies any fevers at home.  Review of Systems  Positive: Syncope Negative: Chest pain, shortness of breath  Physical Exam  BP (!) 135/103 (BP Location: Right Arm)   Pulse 79   Temp 98.2 F (36.8 C) (Oral)   Resp 18   SpO2 100%  Gen:   Awake, no distress, drowsy, she is oriented to self and that she is at the hospital, she does not know the name of the hospital or day of the week. Resp:  Normal effort, lung sounds clear bilaterally MSK:   Moves extremities without difficulty.  5/5 strength in bilateral upper and lower extremities Other:  No focal neurological deficit.  Cranial nerves are intact.  She has an old left-sided facial droop which has been previously documented.  No nystagmus.  No pronator drift.  Sensation appears intact.  Gait not observed.  Medical Decision Making  Medically screening exam initiated at 12:04 PM.  Appropriate orders placed.  Khianna Blazina was informed that the remainder of the evaluation will be  completed by another provider, this initial triage assessment does not replace that evaluation, and the importance of remaining in the ED until their evaluation is complete.     Cristopher Peru, PA-C 05/27/21 1209    Milagros Loll, MD 05/27/21 1534

## 2021-05-27 NOTE — ED Notes (Signed)
Pt used bedside toilet well

## 2021-05-30 LAB — URINE CULTURE: Culture: >=100000 — AB

## 2021-05-31 ENCOUNTER — Telehealth: Payer: Self-pay | Admitting: Emergency Medicine

## 2021-05-31 NOTE — Telephone Encounter (Signed)
Post ED Visit - Positive Culture Follow-up  Culture report reviewed by antimicrobial stewardship pharmacist: [facility] []  , Pharm.D. []  Enzo Bi, Pharm.D., BCPS AQ-ID []  , Pharm.D., BCPS []  Celedonio Miyamoto, Pharm.D., BCPS []  Chaplin, Garvin Fila.D., BCPS, AAHIVP []  , Pharm.D., BCPS, AAHIVP []  Georgina Pillion, PharmD, BCPS []  , PharmD, BCPS []  [place], PharmD, BCPS [x]  [address], PharmD []  , PharmD, BCPS []  Estella Husk, PharmD  Pharmacy Team []  Lysle Pearl, PharmD []  , PharmD []  Phillips Climes, PharmD []  , Rph []  Agapito Games) , PharmD []  Loleta Dicker, PharmD []  , PharmD []  Mervyn Gay, PharmD []  , PharmD []  Vinnie Level, PharmD []  [facility], PharmD []  , PharmD []  Len Childs, PharmD   Positive urine culture Treated with Cephalexin, organism sensitive to the same and no further patient follow-up is required at this time.  Jadd Gasior 05/31/2021, 2:49 PM

## 2021-06-19 ENCOUNTER — Ambulatory Visit: Payer: Medicaid Other | Admitting: Neurology

## 2021-06-19 ENCOUNTER — Other Ambulatory Visit: Payer: Self-pay

## 2021-06-19 ENCOUNTER — Encounter: Payer: Self-pay | Admitting: Neurology

## 2021-06-19 VITALS — BP 139/86 | HR 82 | Ht 64.0 in | Wt 150.0 lb

## 2021-06-19 DIAGNOSIS — Z8673 Personal history of transient ischemic attack (TIA), and cerebral infarction without residual deficits: Secondary | ICD-10-CM | POA: Diagnosis not present

## 2021-06-19 DIAGNOSIS — R0989 Other specified symptoms and signs involving the circulatory and respiratory systems: Secondary | ICD-10-CM | POA: Diagnosis not present

## 2021-06-19 NOTE — Progress Notes (Signed)
[facility] [address] [place]. [place] [address] [phone]       OFFICE FOLLOW-UP NOTE  [name] Date of Birth:  1971-06-18 Medical Record Number:  [id number]   HPI: [name] is a 50 year old African-American lady seen today for first office follow-up visit following ER visit in June 2022.  She is accompanied by her daughter and grandkids and history is obtained from them and review of electronic medical records and I have personally reviewed pertinent available imaging films in PACS. She has past medical history of hypertension, hyperlipidemia, diabetes, smoking, prior stroke in 2010 while in [place] with residual mild left hemiparesis.  Patient was seen in the ER on 12/23/2020 with a poor and inconsistent history but mostly complaining of left hand pain and discomfort which was not felt to be consistent with acute stroke.  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  EEG was obtained which was normal except for excess beta activity which was medication effect.  MRI scan of the brain was obtained which showed old right corona radiata and left caudate head lacunar infarcts with changes of small vessel disease and no acute abnormality.  LDL cholesterol 78 mg percent and hemoglobin A1c was elevated at 8.5.  Echocardiogram showed normal ejection fraction of 60 to 65%.  Patient states she is done well since then.  She has had no recurrent episodes of focal dystonia pain and numbness in the hand.  Still has mild diminished fine motor skills and weakness which is residual from her previous stroke.  She continues to smoke and smokes 2 packs/day and has not yet quit.  She remains on Plavix which is tolerating well without bruising or bleeding.  She states her blood pressure is under good control as her sugars.  She remains on Glucophage and Lipitor and tolerating medications well without side effects.  She  has no new complaints today. ROS:   14 system review of systems is positive for weakness, stiffness, pain, numbness all other systems negative  PMH:  Past Medical History:  Diagnosis Date   HIV (human immunodeficiency virus infection) (HCC)    Hypertension    Type 2 diabetes mellitus without complication, without long-term current use of insulin (HCC) 10/31/2018    Social History:  Social History   Socioeconomic History   Marital status: Single    Spouse name: Not on file   Number of children: Not on file   Years of education: Not on file   Highest education level: Not on file  Occupational History   Not on file  Tobacco Use   Smoking status: Some Days    Types: Cigarettes   Smokeless tobacco: Never  Substance and Sexual Activity   Alcohol use: No   Drug use: No   Sexual activity: Not on file  Other Topics Concern   Not on file  Social History Narrative   Not on file   Social Determinants of Health   Financial Resource Strain: Not on file  Food Insecurity: Not on file  Transportation Needs: Not on file  Physical Activity: Not on file  Stress: Not on file  Social Connections: Not on file  Intimate Partner Violence: Not on file    Medications:   Current Outpatient Medications on File Prior to Visit  Medication Sig Dispense Refill   atorvastatin (LIPITOR) 20 MG tablet Take 20 mg by mouth daily.     cephALEXin (KEFLEX) 500  MG capsule 2 caps po bid x 7 days 28 capsule 0   clopidogrel (PLAVIX) 75 MG tablet Take 75 mg by mouth daily.     dicyclomine (BENTYL) 20 MG tablet Take 1 tablet (20 mg total) by mouth 3 (three) times daily as needed for spasms. 20 tablet 0   dolutegravir (TIVICAY) 50 MG tablet Take 50 mg by mouth daily.     emtricitabine-tenofovir AF (DESCOVY) 200-25 MG tablet Take 1 tablet by mouth daily.     lisinopril-hydrochlorothiazide (ZESTORETIC) 20-12.5 MG tablet Take 1 tablet by mouth daily. 30 tablet 0   metFORMIN (GLUCOPHAGE) 500 MG tablet Take 500 mg  by mouth 2 (two) times daily.     nicotine (NICODERM CQ - DOSED IN MG/24 HOURS) 21 mg/24hr patch Place 1 patch (21 mg total) onto the skin daily. 28 patch 0   No current facility-administered medications on file prior to visit.    Allergies:  No Known Allergies  Physical Exam General: well developed, well nourished, seated, in no evident distress Head: head normocephalic and atraumatic.  Neck: supple with soft right carotid bruit  Cardiovascular: regular rate and rhythm, no murmurs Musculoskeletal: no deformity Skin:  no rash/petichiae Vascular:  Normal pulses all extremities Vitals:   06/19/21 1053  BP: 139/86  Pulse: 82   Neurologic Exam Mental Status: Awake and fully alert. Oriented to place and time. Recent and remote memory intact. Attention span, concentration and fund of knowledge appropriate. Mood and affect appropriate.  Cranial Nerves: Fundoscopic exam reveals sharp disc margins. Pupils equal, briskly reactive to light. Extraocular movements full without nystagmus. Visual fields full to confrontation. Hearing intact. Facial sensation intact.  Left facial weakness., tongue, palate moves normally and symmetrically.  Motor: Normal bulk and tone. Normal strength in all tested extremity muscles except mild weakness of left grip and ankle dorsiflexors.  Diminished fine finger movements on the left.  Orbits right over left upper extremity.  Tone is increased on the left compared to the right.. Sensory.: intact to touch ,pinprick .position and vibratory sensation.  Coordination: Rapid alternating movements normal in all extremities. Finger-to-nose and heel-to-shin performed accurately bilaterally. Gait and Station: Arises from chair without difficulty. Stance is normal. Gait demonstrates normal stride length and balance but drags left leg slightly.. Able to heel, toe and tandem walk with moderate difficulty.  Reflexes: 2+ and asymmetric and brisker on the left. Toes downgoing.   NIHSS   1 Modified Rankin  2   ASSESSMENT: 50 year old African-American lady with remote history of strokes with residual left mild hemiparesis who presented on 12/23/2020 with left hand pain cramping thought to be focal dystonia which responded to Ativan.  Vascular risk factors of smoking, hypertension , hyperlipidemia, carotid bruit and diabetes     PLAN:I had a long d/w patient and her daughter about her remote stroke, carotid bruit,risk for recurrent stroke/TIAs, personally independently reviewed imaging studies and stroke evaluation results and answered questions.Continue Plavix  for secondary stroke prevention and maintain strict control of hypertension with blood pressure goal below 130/90, diabetes with hemoglobin A1c goal below 6.5% and lipids with LDL cholesterol goal below 70 mg/dL. I also advised the patient to eat a healthy diet with plenty of whole grains, cereals, fruits and vegetables, exercise regularly and maintain ideal body weight .check carotid ultrasound and transcranial Doppler studies and patient was counseled to quit smoking and seek help from primary care physician for the same.  Followup in the future with my nurse practitioner in 6 months or call  earlier if necessary.Greater than 50% of time during this 25 minute visit was spent on counseling,explanation of diagnosis of lacunar stroke and discussion about stroke recurrence and prevention, planning of further management, discussion with patient and family and coordination of care Antony Contras, MD Note: This document was prepared with digital dictation and possible smart phrase technology. Any transcriptional errors that result from this process are unintentional

## 2021-06-19 NOTE — Patient Instructions (Signed)
I had a long d/w patient and her daughter about her remote stroke, carotid bruit,risk for recurrent stroke/TIAs, personally independently reviewed imaging studies and stroke evaluation results and answered questions.Continue Plavix  for secondary stroke prevention and maintain strict control of hypertension with blood pressure goal below 130/90, diabetes with hemoglobin A1c goal below 6.5% and lipids with LDL cholesterol goal below 70 mg/dL. I also advised the patient to eat a healthy diet with plenty of whole grains, cereals, fruits and vegetables, exercise regularly and maintain ideal body weight .check carotid ultrasound and transcranial Doppler studies and patient was counseled to quit smoking and seek help from primary care physician for the same.  Followup in the future with my nurse practitioner in 6 months or call earlier if necessary. Stroke Prevention Some medical conditions and behaviors can lead to a higher chance of having a stroke. You can help prevent a stroke by eating healthy, exercising, not smoking, and managing any medical conditions you have. Stroke is a leading cause of functional impairment. Primary prevention is particularly important because a majority of strokes are first-time events. Stroke changes the lives of not only those who experience a stroke but also their family and other caregivers. How can this condition affect me? A stroke is a medical emergency and should be treated right away. A stroke can lead to brain damage and can sometimes be life-threatening. If a person gets medical treatment right away, there is a better chance of surviving and recovering from a stroke. What can increase my risk? The following medical conditions may increase your risk of a stroke: Cardiovascular disease. High blood pressure (hypertension). Diabetes. High cholesterol. Sickle cell disease. Blood clotting disorders (hypercoagulable state). Obesity. Sleep disorders (obstructive sleep  apnea). Other risk factors include: Being older than age 29. Having a history of blood clots, stroke, or mini-stroke (transient ischemic attack, TIA). Genetic factors, such as race, ethnicity, or a family history of stroke. Smoking cigarettes or using other tobacco products. Taking birth control pills, especially if you also use tobacco. Heavy use of alcohol or drugs, especially cocaine and methamphetamine. Physical inactivity. What actions can I take to prevent this? Manage your health conditions High cholesterol levels. Eating a healthy diet is important for preventing high cholesterol. If cholesterol cannot be managed through diet alone, you may need to take medicines. Take any prescribed medicines to control your cholesterol as told by your health care provider. Hypertension. To reduce your risk of stroke, try to keep your blood pressure below 130/80. Eating a healthy diet and exercising regularly are important for controlling blood pressure. If these steps are not enough to manage your blood pressure, you may need to take medicines. Take any prescribed medicines to control hypertension as told by your health care provider. Ask your health care provider if you should monitor your blood pressure at home. Have your blood pressure checked every year, even if your blood pressure is normal. Blood pressure increases with age and some medical conditions. Diabetes. Eating a healthy diet and exercising regularly are important parts of managing your blood sugar (glucose). If your blood sugar cannot be managed through diet and exercise, you may need to take medicines. Take any prescribed medicines to control your diabetes as told by your health care provider. Get evaluated for obstructive sleep apnea. Talk to your health care provider about getting a sleep evaluation if you snore a lot or have excessive sleepiness. Make sure that any other medical conditions you have, such as atrial fibrillation or  atherosclerosis, are managed. Nutrition Follow instructions from your health care provider about what to eat or drink to help manage your health condition. These instructions may include: Reducing your daily calorie intake. Limiting how much salt (sodium) you use to 1,500 milligrams (mg) each day. Using only healthy fats for cooking, such as olive oil, canola oil, or sunflower oil. Eating healthy foods. You can do this by: Choosing foods that are high in fiber, such as whole grains, and fresh fruits and vegetables. Eating at least 5 servings of fruits and vegetables a day. Try to fill one-half of your plate with fruits and vegetables at each meal. Choosing lean protein foods, such as lean cuts of meat, poultry without skin, fish, tofu, beans, and nuts. Eating low-fat dairy products. Avoiding foods that are high in sodium. This can help lower blood pressure. Avoiding foods that have saturated fat, trans fat, and cholesterol. This can help prevent high cholesterol. Avoiding processed and prepared foods. Counting your daily carbohydrate intake.  Lifestyle If you drink alcohol: Limit how much you have to: 0-1 drink a day for women who are not pregnant. 0-2 drinks a day for men. Know how much alcohol is in your drink. In the [place] one drink equals one 12 oz bottle of beer (369m), one 5 oz glass of wine (145m, or one 1 oz glass of hard liquor (4415m Do not use any products that contain nicotine or tobacco. These products include cigarettes, chewing tobacco, and vaping devices, such as e-cigarettes. If you need help quitting, ask your health care provider. Avoid secondhand smoke. Do not use drugs. Activity  Try to stay at a healthy weight. Get at least 30 minutes of exercise on most days, such as: Fast walking. Biking. Swimming. Medicines Take over-the-counter and prescription medicines only as told by your health care provider. Aspirin or blood thinners (antiplatelets or  anticoagulants) may be recommended to reduce your risk of forming blood clots that can lead to stroke. Avoid taking birth control pills. Talk to your health care provider about the risks of taking birth control pills if: You are over 35 28ars old. You smoke. You get very bad headaches. You have had a blood clot. Where to find more information American Stroke Association: www.strokeassociation.org Get help right away if: You or a loved one has any symptoms of a stroke. "BE FAST" is an easy way to remember the main warning signs of a stroke: B - Balance. Signs are dizziness, sudden trouble walking, or loss of balance. E - Eyes. Signs are trouble seeing or a sudden change in vision. F - Face. Signs are sudden weakness or numbness of the face, or the face or eyelid drooping on one side. A - Arms. Signs are weakness or numbness in an arm. This happens suddenly and usually on one side of the body. S - Speech. Signs are sudden trouble speaking, slurred speech, or trouble understanding what people say. T - Time. Time to call emergency services. Write down what time symptoms started. You or a loved one has other signs of a stroke, such as: A sudden, severe headache with no known cause. Nausea or vomiting. Seizure. These symptoms may represent a serious problem that is an emergency. Do not wait to see if the symptoms will go away. Get medical help right away. Call your local emergency services (911 in the [place] Do not drive yourself to the hospital. Summary You can help to prevent a stroke by eating healthy, exercising, not smoking, limiting alcohol intake,  and managing any medical conditions you may have. Do not use any products that contain nicotine or tobacco. These include cigarettes, chewing tobacco, and vaping devices, such as e-cigarettes. If you need help quitting, ask your health care provider. Remember "BE FAST" for warning signs of a stroke. Get help right away if you or a loved one has any  of these signs. This information is not intended to replace advice given to you by your health care provider. Make sure you discuss any questions you have with your health care provider. Document Revised: 02/05/2020 Document Reviewed: 02/05/2020 Elsevier Patient Education  2022 [employer].

## 2021-06-23 ENCOUNTER — Telehealth: Payer: Self-pay | Admitting: Neurology

## 2021-06-23 NOTE — Telephone Encounter (Signed)
ready to be scheduled [facility] no auth req, sent a message to Lupita Leash she will reach out to the patient to schedule .

## 2021-07-02 ENCOUNTER — Ambulatory Visit (HOSPITAL_COMMUNITY): Payer: Medicaid Other | Attending: Neurology

## 2021-07-02 ENCOUNTER — Ambulatory Visit (HOSPITAL_COMMUNITY): Admission: RE | Admit: 2021-07-02 | Payer: Medicaid Other | Source: Ambulatory Visit

## 2021-07-29 ENCOUNTER — Ambulatory Visit (HOSPITAL_COMMUNITY): Admission: RE | Admit: 2021-07-29 | Payer: Medicaid Other | Source: Ambulatory Visit

## 2021-07-29 ENCOUNTER — Ambulatory Visit (HOSPITAL_COMMUNITY): Payer: Medicaid Other | Attending: Neurology

## 2021-08-06 IMAGING — CT CT HEAD W/O CM
3 series · 14 of 47 positions shown, 16 images · non-contrast
Comparison: No pertinent prior exams available for comparison.

CLINICAL DATA: Neuro deficit, acute, stroke suspected. Additional
history provided: Headache and hypertension.

EXAM:
CT HEAD WITHOUT CONTRAST
TECHNIQUE: Contiguous axial images were obtained from the base of the skull
through the vertex without intravenous contrast.

[Series 3: head 5.0 h30s · axial · 0.41mm/px · z∈[-148,-13]mm · 8 of 33 slices shown, 10 images]
[im 3/33  brain]
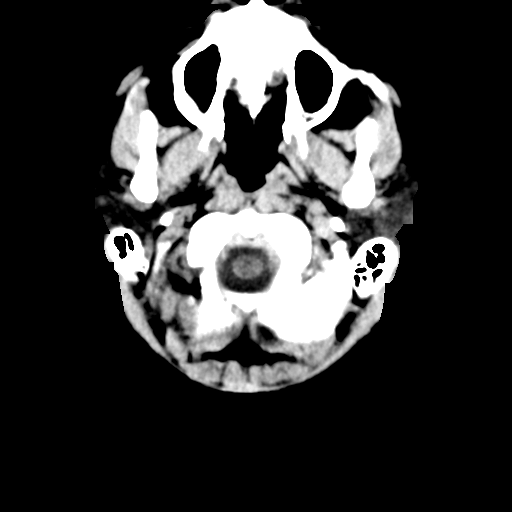
[im 3/33  bone]
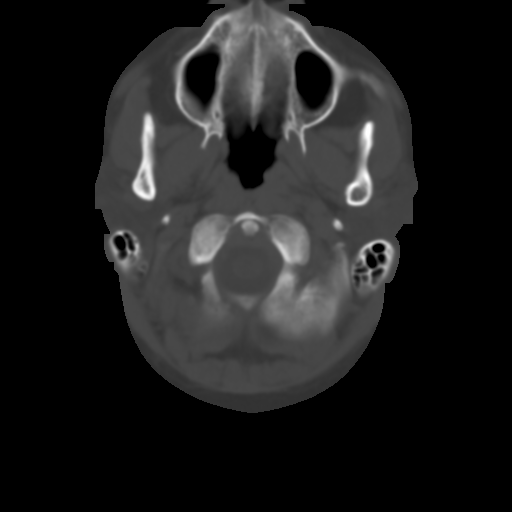
[im 7/33  brain]
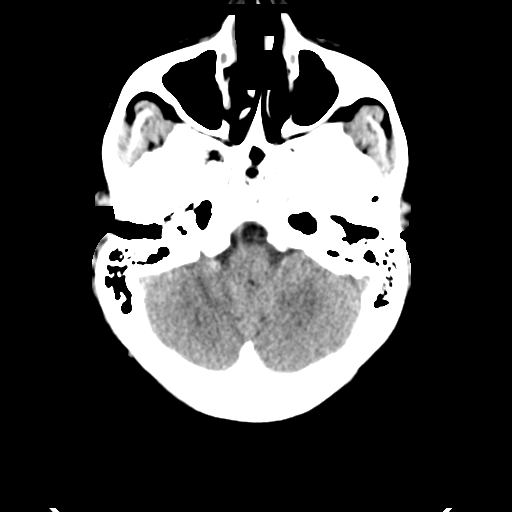
[im 10/33  brain]
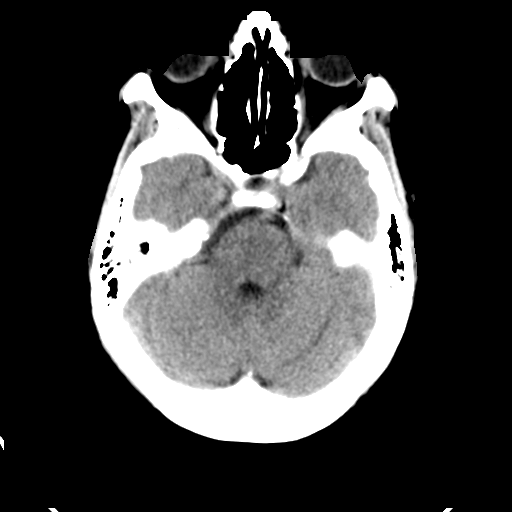
[im 15/33  brain]
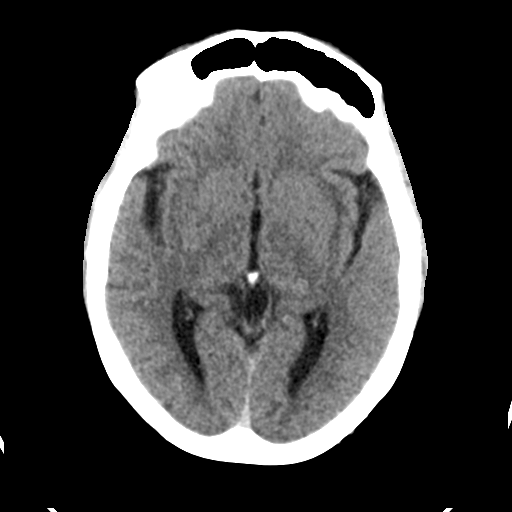
[im 18/33  brain]
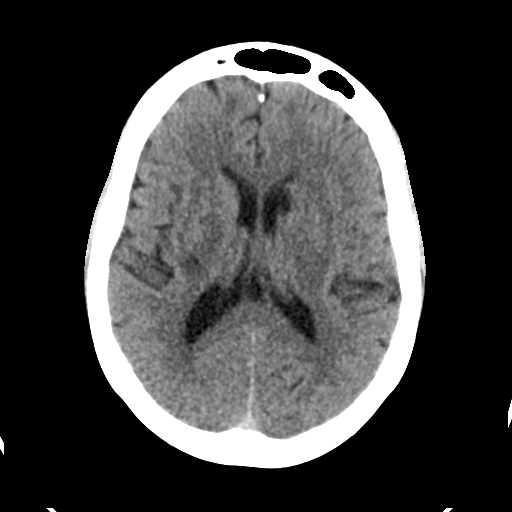
[im 18/33  bone]
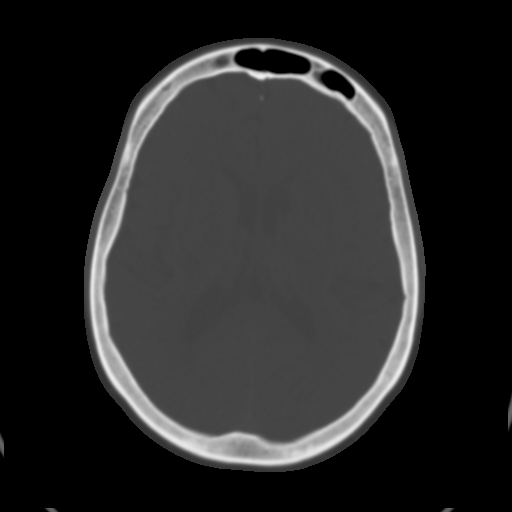
[im 23/33  brain]
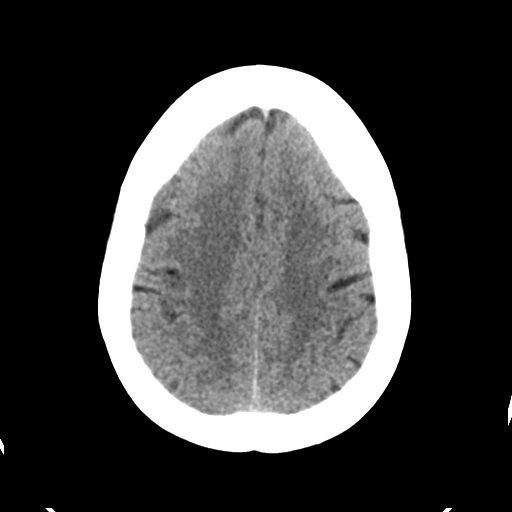
[im 26/33  brain]
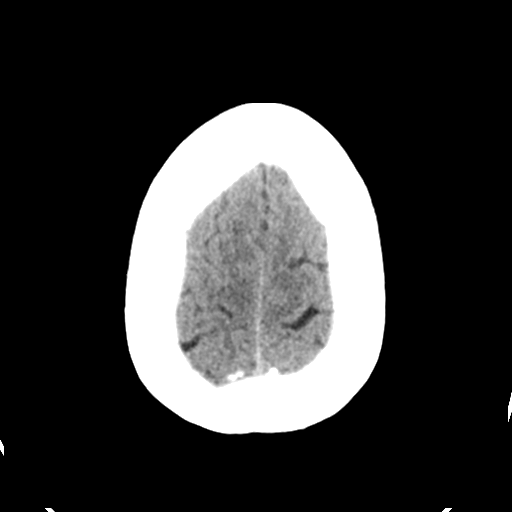
[im 30/33  brain]
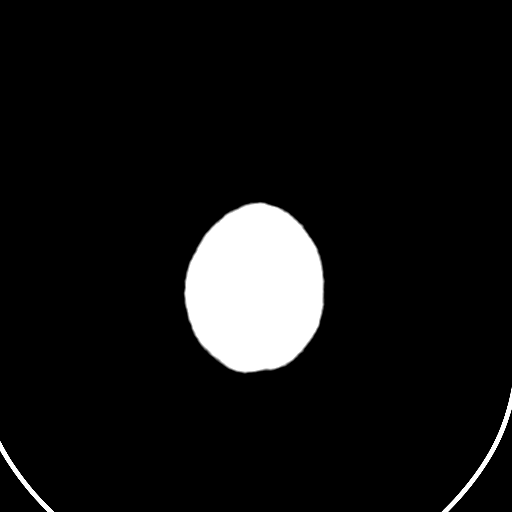

[Series 5: head 3.0 mpr cor · coronal · 0.36mm/px · 3 of 73 slices shown]
[im 25/73  brain]
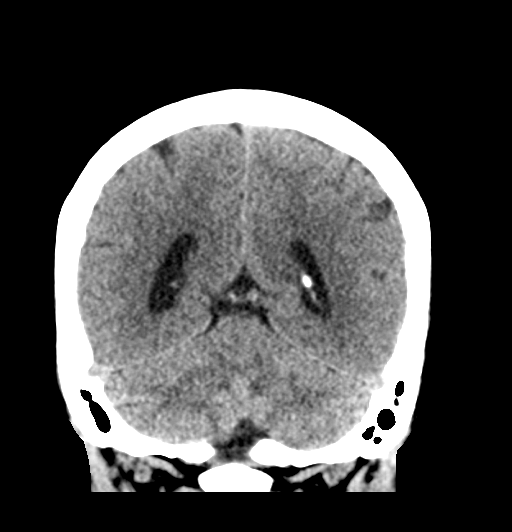
[im 33/73  brain]
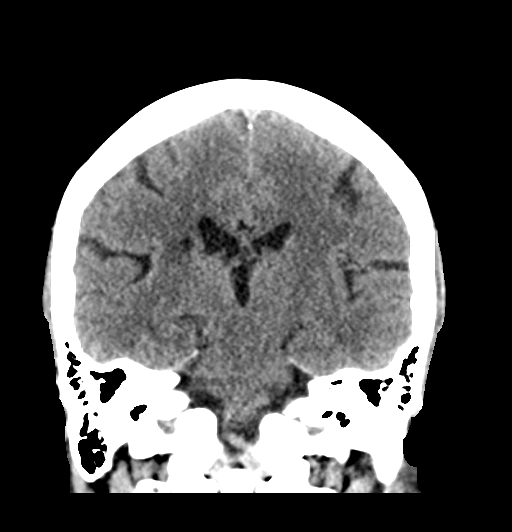
[im 41/73  brain]
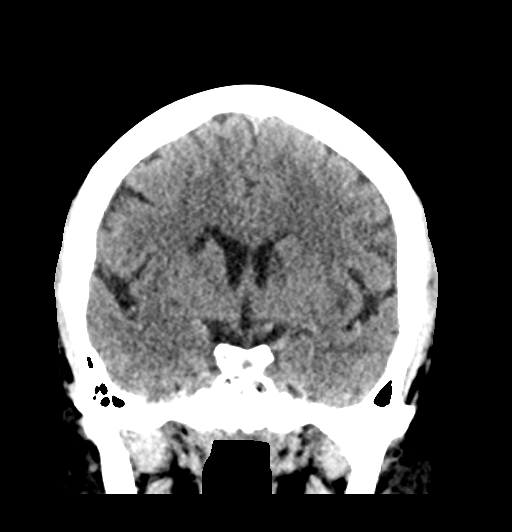

[Series 6: head 3.0 mpr sag · sagittal · 0.32mm/px · 3 of 56 slices shown]
[im 19/56  brain]
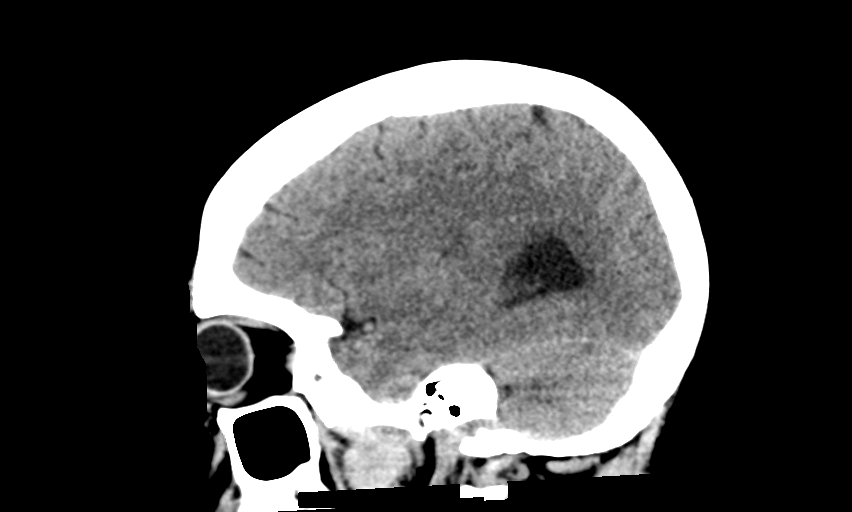
[im 28/56  brain]
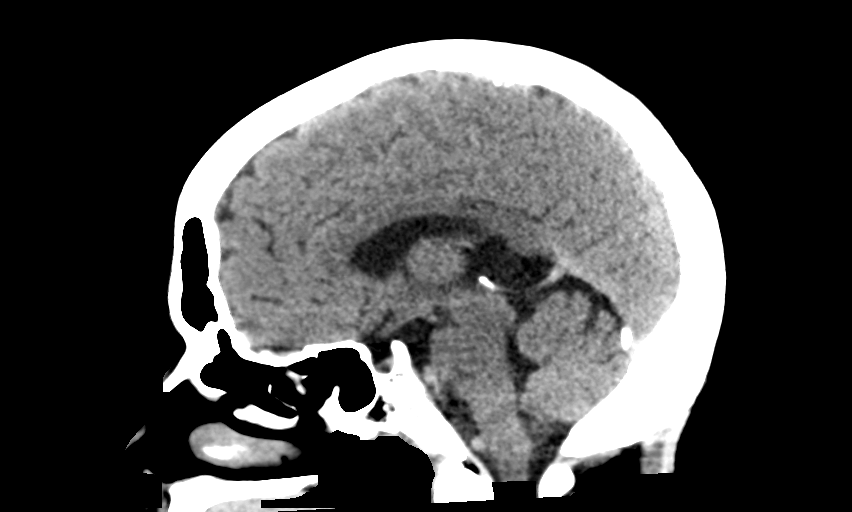
[im 37/56  brain]
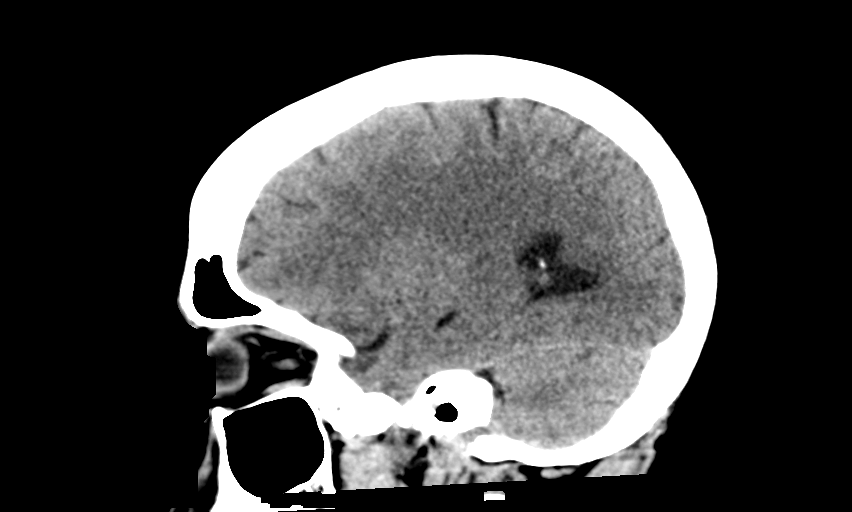

[14 of 47 positions shown; findings below may reference images not displayed]

FINDINGS: Brain:

Cerebral volume is normal.

There is a lacunar infarct within the right caudate nucleus which is
too small to characterize and age-indeterminate (series 3, image 19)
(series 5, image 36).

Three additional lacunar infarcts are present, two within the right
caudate nucleus/corona radiata and one within the left caudate head.
These lacunar infarcts appear chronic.

There is no acute intracranial hemorrhage.

No demarcated cortical infarct.

No extra-axial fluid collection.

No evidence of intracranial mass.

No midline shift.

Vascular: No hyperdense vessel.

Skull: Normal. Negative for fracture or focal lesion.

Sinuses/Orbits: Visualized orbits show no acute finding. Small
volume frothy secretions within the right sphenoid sinus. Trace
mucosal thickening within the bilateral maxillary sinuses.
IMPRESSION: No acute intracranial hemorrhage or acute demarcated cortical
infarction.

There is a lacunar infarct within the right caudate nucleus which is
too small to characterize and age-indeterminate. Consider brain MRI
for further evaluation.

Chronic lacunar infarcts are also present within the right caudate
nucleus/corona radiata and left caudate head.

Mild paranasal sinus disease as described. Correlate for acute
sinusitis.

## 2021-08-14 ENCOUNTER — Ambulatory Visit (HOSPITAL_COMMUNITY): Payer: Medicaid Other

## 2021-08-15 ENCOUNTER — Ambulatory Visit (HOSPITAL_COMMUNITY): Payer: Medicaid Other

## 2021-08-15 ENCOUNTER — Ambulatory Visit (HOSPITAL_COMMUNITY)
Admission: RE | Admit: 2021-08-15 | Discharge: 2021-08-15 | Disposition: A | Discharge status: Home / Self Care | Payer: Medicaid Other | Source: Ambulatory Visit | Attending: Neurology | Admitting: Neurology

## 2021-08-15 ENCOUNTER — Other Ambulatory Visit: Payer: Self-pay

## 2021-08-15 DIAGNOSIS — R0989 Other specified symptoms and signs involving the circulatory and respiratory systems: Secondary | ICD-10-CM | POA: Diagnosis present

## 2021-08-15 NOTE — Progress Notes (Signed)
Carotid artery duplex has been completed. Preliminary results can be found in CV Proc through chart review.   08/15/21 9:27 AM Olen Cordial RVT

## 2021-08-20 NOTE — Progress Notes (Signed)
Can you inform the patient that carotid ultrasound study shows no significant blockages of either carotid arteries in the neck on both sides

## 2021-08-21 ENCOUNTER — Telehealth: Payer: Self-pay

## 2021-08-21 NOTE — Telephone Encounter (Signed)
-----   Message from Sunday Spillers, RN sent at 08/21/2021  7:48 AM EST -----  ----- Message ----- From: Micki Riley, MD Sent: 08/20/2021   4:31 PM EST To: Gna-Pod 2 Results  Can you inform the patient that carotid ultrasound study shows no significant blockages of either carotid arteries in the neck on both sides

## 2021-08-21 NOTE — Telephone Encounter (Signed)
I called the pt and advised of results via vm ( ok per dpr).  Pt advised to call back if she has questions.

## 2021-09-08 ENCOUNTER — Ambulatory Visit (HOSPITAL_COMMUNITY): Admission: RE | Admit: 2021-09-08 | Payer: Medicaid Other | Source: Ambulatory Visit

## 2021-10-01 DIAGNOSIS — A749 Chlamydial infection, unspecified: Secondary | ICD-10-CM | POA: Insufficient documentation

## 2021-12-18 ENCOUNTER — Ambulatory Visit: Payer: Medicaid Other | Admitting: Adult Health

## 2021-12-18 NOTE — Progress Notes (Deleted)
[facility] [address] [place]. [place] [address] [phone]       OFFICE FOLLOW-UP NOTE  Melissa Vaughan Date of Birth:  1971-06-06 Medical Record Number:  [id number]    Primary neurologist: Dr. Pearlean Brownie Reason for visit: Focal dystonia, hx of stroke  No chief complaint on file.      HPI:   Update 12/18/2021 JM: Patient returns for 47-month follow-up.  She has been stable since prior visit.  She has not had any additional issues with dystonia and denies any new stroke/TIA symptoms.  She still has mild left-sided weakness which is chronic from her prior stroke and has been stable.  Reports compliance on Plavix and atorvastatin, denies side effects.  Blood pressure today ***.  PCP ***.  Routinely follows with infectious disease for HIV.       History provided for reference purposes only Initial visit 06/19/2021 Dr. Pearlean Brownie: Melissa Vaughan is a 51 year old African-American lady seen today for first office follow-up visit following ER visit in June 2022.  She is accompanied by her daughter and grandkids and history is obtained from them and review of electronic medical records and I have personally reviewed pertinent available imaging films in PACS. She has past medical history of hypertension, hyperlipidemia, diabetes, smoking, prior stroke in 2010 while in [place] with residual mild left hemiparesis.  Patient was seen in the ER on 12/23/2020 with a poor and inconsistent history but mostly complaining of left hand pain and discomfort which was not felt to be consistent with acute stroke.  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  EEG was obtained which was normal except for excess beta activity which was medication effect.  MRI scan of the brain was obtained which showed old right corona radiata and left caudate head lacunar infarcts with changes of small vessel disease and no acute abnormality.  LDL  cholesterol 78 mg percent and hemoglobin A1c was elevated at 8.5.  Echocardiogram showed normal ejection fraction of 60 to 65%.  Patient states she is done well since then.  She has had no recurrent episodes of focal dystonia pain and numbness in the hand.  Still has mild diminished fine motor skills and weakness which is residual from her previous stroke.  She continues to smoke and smokes 2 packs/day and has not yet quit.  She remains on Plavix which is tolerating well without bruising or bleeding.  She states her blood pressure is under good control as her sugars.  She remains on Glucophage and Lipitor and tolerating medications well without side effects.  She has no new complaints today.    ROS:   14 system review of systems is positive for weakness, stiffness, pain, numbness all other systems negative  PMH:  Past Medical History:  Diagnosis Date   HIV (human immunodeficiency virus infection) (HCC)    Hypertension    Type 2 diabetes mellitus without complication, without long-term current use of insulin (HCC) 10/31/2018    Social History:  Social History   Socioeconomic History   Marital status: Single    Spouse name: Not on file   Number of children: Not on file   Years of education: Not on file   Highest education level: Not on file  Occupational History   Not on file  Tobacco Use   Smoking status: Some Days    Types: Cigarettes   Smokeless tobacco: Never  Substance and Sexual Activity  Alcohol use: No   Drug use: No   Sexual activity: Not on file  Other Topics Concern   Not on file  Social History Narrative   Not on file   Social Determinants of Health   Financial Resource Strain: Not on file  Food Insecurity: Not on file  Transportation Needs: Not on file  Physical Activity: Not on file  Stress: Not on file  Social Connections: Not on file  Intimate Partner Violence: Not on file    Medications:   Current Outpatient Medications on File Prior to Visit   Medication Sig Dispense Refill   atorvastatin (LIPITOR) 20 MG tablet Take 20 mg by mouth daily.     cephALEXin (KEFLEX) 500 MG capsule 2 caps po bid x 7 days 28 capsule 0   clopidogrel (PLAVIX) 75 MG tablet Take 75 mg by mouth daily.     dicyclomine (BENTYL) 20 MG tablet Take 1 tablet (20 mg total) by mouth 3 (three) times daily as needed for spasms. 20 tablet 0   dolutegravir (TIVICAY) 50 MG tablet Take 50 mg by mouth daily.     emtricitabine-tenofovir AF (DESCOVY) 200-25 MG tablet Take 1 tablet by mouth daily.     lisinopril-hydrochlorothiazide (ZESTORETIC) 20-12.5 MG tablet Take 1 tablet by mouth daily. 30 tablet 0   metFORMIN (GLUCOPHAGE) 500 MG tablet Take 500 mg by mouth 2 (two) times daily.     nicotine (NICODERM CQ - DOSED IN MG/24 HOURS) 21 mg/24hr patch Place 1 patch (21 mg total) onto the skin daily. 28 patch 0   No current facility-administered medications on file prior to visit.    Allergies:  No Known Allergies  Physical Exam There were no vitals filed for this visit. There is no height or weight on file to calculate BMI.   General: well developed, well nourished, seated, in no evident distress Head: head normocephalic and atraumatic.  Neck: supple with soft right carotid bruit  Cardiovascular: regular rate and rhythm, no murmurs Musculoskeletal: no deformity Skin:  no rash/petichiae Vascular:  Normal pulses all extremities  Neurologic Exam Mental Status: Awake and fully alert. Oriented to place and time. Recent and remote memory intact. Attention span, concentration and fund of knowledge appropriate. Mood and affect appropriate.  Cranial Nerves: Pupils equal, briskly reactive to light. Extraocular movements full without nystagmus. Visual fields full to confrontation. Hearing intact. Facial sensation intact.  Left facial weakness., tongue, palate moves normally and symmetrically.  Motor: Normal bulk and tone. Normal strength in all tested extremity muscles except mild  weakness of left grip and ankle dorsiflexors.  Diminished fine finger movements on the left.  Orbits right over left upper extremity.  Tone is increased on the left compared to the right.. Sensory.: intact to touch ,pinprick .position and vibratory sensation.  Coordination: Rapid alternating movements normal in all extremities. Finger-to-nose and heel-to-shin performed accurately bilaterally. Gait and Station: Arises from chair without difficulty. Stance is normal. Gait demonstrates normal stride length and balance but drags left leg slightly.. Able to heel, toe and tandem walk with moderate difficulty.  Reflexes: 2+ and asymmetric and brisker on the left. Toes downgoing.        ASSESSMENT: 51 year old African-American lady with remote history of strokes with residual left mild hemiparesis who presented on 12/23/2020 with left hand pain cramping thought to be focal dystonia which responded to Ativan.  She has not had any other issues with dystonia.  Vascular risk factors of smoking, hypertension , hyperlipidemia, carotid bruit and diabetes.  Carotid ultrasound 07/2021 bilateral ICA 1 to 39% stenosis.  Has not yet completed TCD     PLAN:  -Continue Plavix and atorvastatin for secondary stroke prevention and  -Advised her to obtain a PCP to ensure routine follow-up for aggressive stroke risk factor management including BP goal<130/90, HLD with LDL goal<70 and DM with A1c.<7  -Stroke labs 09/2021: LDL 104 (prior to being on atorvastatin), A1c 6.9 -needs to complete TCD -she was a no-show for previously scheduled TCD x3 - will reorder to ensure this is completed ***.  Discussed indication and importance for having this completed -Discussed complete tobacco cessation as continued use greatly increases risk of additional strokes        CC:  [facility] provider: Pcp, No   I spent *** minutes of face-to-face and non-face-to-face time with patient.  This included previsit chart review, lab review,  study review, order entry, electronic health record documentation, patient education  Ihor Austin, [facility]   [facility] [address] [place], [place] [address]  Phone [phone] Fax [phone] Note: This document was prepared with digital dictation and possible smart phrase technology. Any transcriptional errors that result from this process are unintentional.

## 2022-06-17 ENCOUNTER — Encounter: Payer: Self-pay | Admitting: Physician Assistant

## 2022-06-17 ENCOUNTER — Other Ambulatory Visit (HOSPITAL_COMMUNITY): Payer: Self-pay

## 2022-06-17 ENCOUNTER — Encounter: Payer: Self-pay | Admitting: Critical Care Medicine

## 2022-06-17 ENCOUNTER — Other Ambulatory Visit: Payer: Self-pay | Admitting: Critical Care Medicine

## 2022-06-17 MED ORDER — VALSARTAN-HYDROCHLOROTHIAZIDE 320-25 MG PO TABS
1.0000 | ORAL_TABLET | Freq: Every day | ORAL | 3 refills | Status: AC
  Filled 2022-06-17: qty 30, 30d supply, fill #0

## 2022-06-17 NOTE — Progress Notes (Signed)
Pt seen by Dr Delford Field.  Past Medical History:  Diagnosis Date   CVA (cerebral vascular accident) (HCC)    HIV (human immunodeficiency virus infection) (HCC)    Hypertension    Type 2 diabetes mellitus without complication, without long-term current use of insulin (HCC) 10/31/2018   Pt last had a BP taken a year ago.   She is compliant w/ HIV and other meds  Harlan Stains MD at [facility], she has been prescribed amlodipine, lisinopril on 04/28/2022.  Her lisinopril was d/c'd, she was started on valsartan/HCTZ 320/25 She will get this today, get a BP recheck tomorrow.   She is leaving for [place], [place] on Friday with her sister .   BP very elevated, no sig change on recheck.  Today's Vitals   06/17/22 1519  BP: (!) 237/128  Pulse: 100  SpO2: 99%   She has had some mild HA, no vision problems.  General: Well developed, well nourished, female in no acute distress Head: Eyes PERRLA, Head normocephalic and atraumatic, has L facial droop from prev CVA Lungs: clear bilaterally to auscultation. Heart: HRRR S1 S2, without rub or gallop. No murmur. 4/4 extremity pulses are 2+ & equal. No JVD. Abdomen: Bowel sounds are present, abdomen soft and non-tender  Msk: Normal strength and tone for age. Extremities: No clubbing, cyanosis or edema.    Skin:  No rashes or lesions noted. Neuro: Alert and oriented X 3. Psych:  Good affect, responds appropriately  Theodore Demark, PA-C 06/17/2022 3:46 PM

## 2022-06-18 ENCOUNTER — Encounter: Payer: Self-pay | Admitting: *Deleted

## 2022-06-18 ENCOUNTER — Other Ambulatory Visit (HOSPITAL_COMMUNITY): Payer: Self-pay

## 2022-06-18 ENCOUNTER — Other Ambulatory Visit: Payer: Self-pay | Admitting: Critical Care Medicine

## 2022-06-18 MED ORDER — CARVEDILOL 25 MG PO TABS: 25.0000 mg | ORAL_TABLET | Freq: Two times a day (BID) | ORAL | 3 refills | Status: DC

## 2022-06-18 MED ORDER — CARVEDILOL 25 MG PO TABS: 25.0000 mg | ORAL_TABLET | Freq: Two times a day (BID) | ORAL | 3 refills | Status: AC

## 2022-06-18 MED ORDER — CLONIDINE HCL 0.1 MG PO TABS: 0.1000 mg | ORAL_TABLET | Freq: Two times a day (BID) | ORAL | 1 refills | Status: DC

## 2022-06-18 MED ORDER — CLONIDINE HCL 0.1 MG PO TABS: 0.1000 mg | ORAL_TABLET | Freq: Two times a day (BID) | ORAL | 1 refills | Status: AC

## 2022-06-18 NOTE — Progress Notes (Signed)
HTN meds sent

## 2022-06-18 NOTE — Congregational Nurse Program (Signed)
Pt in for f/u from 11/29 Denies h/a Blurred vision N&V Weakness of extremties Short of breath Chestpain States "I feel pretty good" Notified dr Delford Field will get meds for pt and instruct on taking them. Pt is planning on leaving shelter 12/1
